# Patient Record
Sex: Male | Born: 1955
Health system: Midwestern US, Community
[De-identification: ages and names within clinical notes are randomized; demographics above are authoritative.]

## PROBLEM LIST (undated history)

## (undated) ENCOUNTER — Inpatient Hospital Stay: Discharge: 2022-08-30 | Payer: MEDICARE

## (undated) DIAGNOSIS — K759 Inflammatory liver disease, unspecified: Secondary | ICD-10-CM

## (undated) DIAGNOSIS — F209 Schizophrenia, unspecified: Secondary | ICD-10-CM

## (undated) HISTORY — PX: ABDOMINAL SURGERY: SHX537

---

## 2013-12-28 ENCOUNTER — Emergency Department (HOSPITAL_COMMUNITY)
Admission: EM | Admit: 2013-12-28 | Discharge: 2013-12-29 | Disposition: A | Discharge status: Other Acute Inpatient Hospital | Payer: Self-pay | Attending: Psychiatry | Admitting: Psychiatry

## 2013-12-28 ENCOUNTER — Encounter (HOSPITAL_COMMUNITY): Payer: Self-pay | Admitting: Emergency Medicine

## 2013-12-28 DIAGNOSIS — F22 Delusional disorders: Secondary | ICD-10-CM | POA: Insufficient documentation

## 2013-12-28 DIAGNOSIS — F17200 Nicotine dependence, unspecified, uncomplicated: Secondary | ICD-10-CM | POA: Insufficient documentation

## 2013-12-28 DIAGNOSIS — E119 Type 2 diabetes mellitus without complications: Secondary | ICD-10-CM | POA: Insufficient documentation

## 2013-12-28 DIAGNOSIS — I10 Essential (primary) hypertension: Secondary | ICD-10-CM | POA: Insufficient documentation

## 2013-12-28 LAB — URINALYSIS, ROUTINE W REFLEX MICROSCOPIC
Bilirubin Urine: NEGATIVE
Glucose, UA: NEGATIVE mg/dL
Hgb urine dipstick: NEGATIVE
Ketones, ur: 15 mg/dL — AB
LEUKOCYTES UA: NEGATIVE
NITRITE: NEGATIVE
Protein, ur: NEGATIVE mg/dL
UROBILINOGEN UA: 1 mg/dL (ref 0.0–1.0)
pH: 5.5 (ref 5.0–8.0)

## 2013-12-28 LAB — COMPREHENSIVE METABOLIC PANEL
ALT: 41 U/L (ref 0–53)
AST: 93 U/L — ABNORMAL HIGH (ref 0–37)
Albumin: 4.2 g/dL (ref 3.5–5.2)
Alkaline Phosphatase: 91 U/L (ref 39–117)
BUN: 9 mg/dL (ref 6–23)
CALCIUM: 9.4 mg/dL (ref 8.4–10.5)
CO2: 20 mEq/L (ref 19–32)
CREATININE: 0.65 mg/dL (ref 0.50–1.35)
Chloride: 99 mEq/L (ref 96–112)
GFR calc non Af Amer: >90 mL/min (ref >90)
GLUCOSE: 64 mg/dL — AB (ref 70–99)
Potassium: 4.3 mEq/L (ref 3.7–5.3)
Sodium: 144 mEq/L (ref 137–147)
Total Bilirubin: 0.8 mg/dL (ref 0.3–1.2)
Total Protein: 8.1 g/dL (ref 6.0–8.3)

## 2013-12-28 LAB — RAPID URINE DRUG SCREEN, HOSP PERFORMED
Amphetamines: NOT DETECTED
Barbiturates: NOT DETECTED
Benzodiazepines: NOT DETECTED
COCAINE: NOT DETECTED
OPIATES: NOT DETECTED
Tetrahydrocannabinol: POSITIVE — AB

## 2013-12-28 LAB — CBC WITH DIFFERENTIAL/PLATELET
BASOS ABS: 0 10*3/uL (ref 0.0–0.1)
Basophils Relative: 0 % (ref 0–1)
EOS ABS: 0 10*3/uL (ref 0.0–0.7)
EOS PCT: 0 % (ref 0–5)
HCT: 47.1 % (ref 39.0–52.0)
Hemoglobin: 16.6 g/dL (ref 13.0–17.0)
LYMPHS ABS: 1.3 10*3/uL (ref 0.7–4.0)
Lymphocytes Relative: 16 % (ref 12–46)
MCH: 33.6 pg (ref 26.0–34.0)
MCHC: 35.2 g/dL (ref 30.0–36.0)
MCV: 95.3 fL (ref 78.0–100.0)
Monocytes Absolute: 0.6 10*3/uL (ref 0.1–1.0)
Monocytes Relative: 7 % (ref 3–12)
Neutro Abs: 6.1 10*3/uL (ref 1.7–7.7)
Neutrophils Relative %: 77 % (ref 43–77)
Platelets: 234 10*3/uL (ref 150–400)
RBC: 4.94 MIL/uL (ref 4.22–5.81)
RDW: 12.2 % (ref 11.5–15.5)
WBC: 7.9 10*3/uL (ref 4.0–10.5)

## 2013-12-28 LAB — SALICYLATE LEVEL: Salicylate Lvl: <2 mg/dL — ABNORMAL LOW (ref 2.8–20.0)

## 2013-12-28 LAB — ETHANOL: ALCOHOL ETHYL (B): 18 mg/dL — AB (ref 0–11)

## 2013-12-28 LAB — ACETAMINOPHEN LEVEL

## 2013-12-28 MED ORDER — TRAZODONE HCL 50 MG PO TABS
50.0000 mg | ORAL_TABLET | Freq: Every evening | ORAL | Status: DC | PRN
  Filled 2013-12-28: qty 1

## 2013-12-28 MED ORDER — QUETIAPINE FUMARATE 25 MG PO TABS
25.0000 mg | ORAL_TABLET | Freq: Two times a day (BID) | ORAL | Status: DC
  Administered 2013-12-28 – 2013-12-29 (×2): 25 mg via ORAL
  Filled 2013-12-28 (×8): qty 1

## 2013-12-28 MED ORDER — QUETIAPINE FUMARATE 25 MG PO TABS
ORAL_TABLET | ORAL | Status: AC
  Filled 2013-12-28: qty 1
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
## 2013-12-28 NOTE — BH Assessment (Signed)
TTS assessment complete.  Jakerria Kingbird, MS, LCASA Assessment Counselor  

## 2013-12-28 NOTE — ED Notes (Signed)
Turesser "Resa" Luiz BlareGraves, [phone] pt sister, (was present when pt was picked up by law enforcement). Pt lives with sister. States pt "acted up today and just walked off". Pt [facility] [place] [facility]. Pt sister states pt has paranoid delusions and is talking to relatives/friends that have passed away.
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
## 2013-12-28 NOTE — BH Assessment (Signed)
Spoke with EDP Dr.James to obtain clinicals prior to assessing patient.   Glorious PeachNajah Presley, MS, LCASA Assessment Counselor
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
## 2013-12-29 ENCOUNTER — Inpatient Hospital Stay (HOSPITAL_COMMUNITY)
Admission: AD | Admit: 2013-12-29 | Discharge: 2014-01-13 | DRG: 885 | Disposition: A | Discharge status: Home / Self Care | Payer: Federal, State, Local not specified - Other | Source: Intra-hospital | Attending: Psychiatry | Admitting: Psychiatry

## 2013-12-29 ENCOUNTER — Encounter (HOSPITAL_COMMUNITY): Payer: Self-pay

## 2013-12-29 DIAGNOSIS — I10 Essential (primary) hypertension: Secondary | ICD-10-CM | POA: Diagnosis present

## 2013-12-29 DIAGNOSIS — G8929 Other chronic pain: Secondary | ICD-10-CM | POA: Diagnosis present

## 2013-12-29 DIAGNOSIS — K759 Inflammatory liver disease, unspecified: Secondary | ICD-10-CM | POA: Diagnosis present

## 2013-12-29 DIAGNOSIS — F17200 Nicotine dependence, unspecified, uncomplicated: Secondary | ICD-10-CM | POA: Diagnosis present

## 2013-12-29 DIAGNOSIS — Z5989 Other problems related to housing and economic circumstances: Secondary | ICD-10-CM | POA: Diagnosis not present

## 2013-12-29 DIAGNOSIS — F39 Unspecified mood [affective] disorder: Secondary | ICD-10-CM | POA: Diagnosis present

## 2013-12-29 DIAGNOSIS — F1210 Cannabis abuse, uncomplicated: Secondary | ICD-10-CM | POA: Diagnosis present

## 2013-12-29 DIAGNOSIS — Z5987 Material hardship due to limited financial resources, not elsewhere classified: Secondary | ICD-10-CM

## 2013-12-29 DIAGNOSIS — G4700 Insomnia, unspecified: Secondary | ICD-10-CM | POA: Diagnosis present

## 2013-12-29 DIAGNOSIS — E119 Type 2 diabetes mellitus without complications: Secondary | ICD-10-CM | POA: Diagnosis present

## 2013-12-29 DIAGNOSIS — F411 Generalized anxiety disorder: Secondary | ICD-10-CM | POA: Diagnosis present

## 2013-12-29 DIAGNOSIS — F1010 Alcohol abuse, uncomplicated: Secondary | ICD-10-CM | POA: Diagnosis present

## 2013-12-29 DIAGNOSIS — Z598 Other problems related to housing and economic circumstances: Secondary | ICD-10-CM

## 2013-12-29 DIAGNOSIS — F200 Paranoid schizophrenia: Principal | ICD-10-CM | POA: Diagnosis present

## 2013-12-29 HISTORY — DX: Inflammatory liver disease, unspecified: K75.9

## 2013-12-29 HISTORY — DX: Schizophrenia, unspecified: F20.9

## 2013-12-29 LAB — BASIC METABOLIC PANEL
BUN: 14 mg/dL (ref 6–23)
CHLORIDE: 97 meq/L (ref 96–112)
CO2: 27 meq/L (ref 19–32)
Calcium: 9.4 mg/dL (ref 8.4–10.5)
Creatinine, Ser: 0.69 mg/dL (ref 0.50–1.35)
GFR calc non Af Amer: >90 mL/min (ref >90)
Glucose, Bld: 132 mg/dL — ABNORMAL HIGH (ref 70–99)
POTASSIUM: 4.1 meq/L (ref 3.7–5.3)
SODIUM: 140 meq/L (ref 137–147)

## 2013-12-29 LAB — TSH: TSH: 0.549 u[IU]/mL (ref 0.350–4.500)

## 2013-12-29 MED ORDER — ACETAMINOPHEN 325 MG PO TABS
650.0000 mg | ORAL_TABLET | Freq: Four times a day (QID) | ORAL | Status: DC | PRN
  Administered 2013-12-31 – 2014-01-08 (×8): 650 mg via ORAL
  Filled 2013-12-29 (×8): qty 2

## 2013-12-29 MED ORDER — VITAMIN B-1 100 MG PO TABS
100.0000 mg | ORAL_TABLET | Freq: Every day | ORAL | Status: DC
  Administered 2013-12-30 – 2014-01-12 (×14): 100 mg via ORAL
  Filled 2013-12-29 (×16): qty 1
  Filled 2013-12-29: qty 14

## 2013-12-29 MED ORDER — PANTOPRAZOLE SODIUM 40 MG PO TBEC
40.0000 mg | DELAYED_RELEASE_TABLET | Freq: Every day | ORAL | Status: DC
  Administered 2013-12-30 – 2014-01-12 (×14): 40 mg via ORAL
  Filled 2013-12-29 (×17): qty 1

## 2013-12-29 MED ORDER — QUETIAPINE FUMARATE 25 MG PO TABS
ORAL_TABLET | ORAL | Status: AC
  Filled 2013-12-29: qty 1

## 2013-12-29 MED ORDER — MAGNESIUM HYDROXIDE 400 MG/5ML PO SUSP: 30.0000 mL | Freq: Every day | ORAL | Status: DC | PRN

## 2013-12-29 MED ORDER — CHLORDIAZEPOXIDE HCL 25 MG PO CAPS
25.0000 mg | ORAL_CAPSULE | Freq: Four times a day (QID) | ORAL | Status: DC | PRN
  Administered 2013-12-29: 25 mg via ORAL
  Filled 2013-12-29: qty 1

## 2013-12-29 MED ORDER — ALUM & MAG HYDROXIDE-SIMETH 200-200-20 MG/5ML PO SUSP: 30.0000 mL | ORAL | Status: DC | PRN

## 2013-12-29 MED ORDER — THIAMINE HCL 100 MG/ML IJ SOLN: 100.0000 mg | Freq: Once | INTRAMUSCULAR | Status: DC

## 2013-12-29 MED ORDER — HYDRALAZINE HCL 10 MG PO TABS
10.0000 mg | ORAL_TABLET | Freq: Four times a day (QID) | ORAL | Status: DC
  Administered 2013-12-29 – 2014-01-13 (×54): 10 mg via ORAL
  Filled 2013-12-29 (×7): qty 1
  Filled 2013-12-29: qty 28
  Filled 2013-12-29 (×11): qty 1
  Filled 2013-12-29: qty 28
  Filled 2013-12-29 (×13): qty 1
  Filled 2013-12-29: qty 28
  Filled 2013-12-29 (×19): qty 1
  Filled 2013-12-29: qty 28
  Filled 2013-12-29 (×24): qty 1

## 2013-12-29 MED ORDER — TRAZODONE HCL 50 MG PO TABS
50.0000 mg | ORAL_TABLET | Freq: Every evening | ORAL | Status: DC | PRN
  Filled 2013-12-29 (×2): qty 1

## 2013-12-29 MED ORDER — NICOTINE 14 MG/24HR TD PT24
14.0000 mg | MEDICATED_PATCH | Freq: Once | TRANSDERMAL | Status: DC
  Administered 2013-12-29: 14 mg via TRANSDERMAL
  Filled 2013-12-29: qty 1

## 2013-12-29 MED ORDER — CHLORDIAZEPOXIDE HCL 25 MG PO CAPS
25.0000 mg | ORAL_CAPSULE | Freq: Four times a day (QID) | ORAL | Status: DC
  Administered 2013-12-30: 25 mg via ORAL
  Filled 2013-12-29: qty 1

## 2013-12-29 MED ORDER — LORAZEPAM 1 MG PO TABS: 1.0000 mg | ORAL_TABLET | ORAL | Status: DC | PRN

## 2013-12-29 MED ORDER — IPRATROPIUM-ALBUTEROL 0.5-2.5 (3) MG/3ML IN SOLN: 3.0000 mL | Freq: Four times a day (QID) | RESPIRATORY_TRACT | Status: DC | PRN

## 2013-12-29 MED ORDER — ONDANSETRON 4 MG PO TBDP: 4.0000 mg | ORAL_TABLET | Freq: Four times a day (QID) | ORAL | Status: AC | PRN

## 2013-12-29 MED ORDER — CHLORDIAZEPOXIDE HCL 25 MG PO CAPS: 25.0000 mg | ORAL_CAPSULE | ORAL | Status: DC

## 2013-12-29 MED ORDER — HYDROXYZINE HCL 25 MG PO TABS: 25.0000 mg | ORAL_TABLET | Freq: Four times a day (QID) | ORAL | Status: AC | PRN

## 2013-12-29 MED ORDER — ADULT MULTIVITAMIN W/MINERALS CH
1.0000 | ORAL_TABLET | Freq: Every day | ORAL | Status: DC
  Administered 2013-12-30 – 2014-01-12 (×14): 1 via ORAL
  Filled 2013-12-29 (×2): qty 1
  Filled 2013-12-29: qty 14
  Filled 2013-12-29 (×14): qty 1

## 2013-12-29 MED ORDER — LISINOPRIL 10 MG PO TABS
10.0000 mg | ORAL_TABLET | Freq: Every day | ORAL | Status: DC
  Administered 2013-12-29 – 2014-01-12 (×15): 10 mg via ORAL
  Filled 2013-12-29 (×2): qty 1
  Filled 2013-12-29: qty 7
  Filled 2013-12-29 (×4): qty 1
  Filled 2013-12-29: qty 2
  Filled 2013-12-29 (×10): qty 1

## 2013-12-29 MED ORDER — ACETAMINOPHEN 325 MG PO TABS
650.0000 mg | ORAL_TABLET | ORAL | Status: DC | PRN
Start: 2013-12-29 — End: 2013-12-29
  Administered 2013-12-29: 650 mg via ORAL
  Filled 2013-12-29: qty 2

## 2013-12-29 MED ORDER — CHLORDIAZEPOXIDE HCL 25 MG PO CAPS: 25.0000 mg | ORAL_CAPSULE | Freq: Every day | ORAL | Status: DC

## 2013-12-29 MED ORDER — HALOPERIDOL 5 MG PO TABS: 5.0000 mg | ORAL_TABLET | Freq: Four times a day (QID) | ORAL | Status: DC | PRN

## 2013-12-29 MED ORDER — LOPERAMIDE HCL 2 MG PO CAPS: 2.0000 mg | ORAL_CAPSULE | ORAL | Status: AC | PRN

## 2013-12-29 MED ORDER — HALOPERIDOL LACTATE 5 MG/ML IJ SOLN: 5.0000 mg | Freq: Four times a day (QID) | INTRAMUSCULAR | Status: DC | PRN

## 2013-12-29 MED ORDER — CHLORDIAZEPOXIDE HCL 25 MG PO CAPS: 25.0000 mg | ORAL_CAPSULE | Freq: Three times a day (TID) | ORAL | Status: DC

## 2013-12-29 NOTE — ED Notes (Signed)
Went into room, pt sleeping, will arouse when spoken to, pt bed linen and paper scrubs are wet, pt assisted to shower per his request, bed linen changed, new paper scrubs given, sitter remains with pt,

## 2013-12-29 NOTE — ED Provider Notes (Signed)
[facility]. Will transfer stable.   Laray AngerKathleen M McManus, DO 12/29/13 1758

## 2013-12-29 NOTE — ED Notes (Signed)
Pt requesting to go outside to smoke, explained to pt that I could give him a nicotine patch, pt agrees, pt also c/o "aching" all over, tylenol given per prn order,

## 2013-12-29 NOTE — Progress Notes (Signed)
Writer faxed patient to the following facilities with open beds:  [facility], [facility], [facility], [facility], [facility], [facility], [facility] and [facility].

## 2013-12-29 NOTE — ED Notes (Addendum)
Pt states that he drinks alcohol usually 40 ounces a day sometimes two, denies any problems with itching, feeling things crawling, seeing images, problems with hearing, tremors.

## 2013-12-29 NOTE — BH Assessment (Signed)
Writer spoke to the ER MD regarding the request for an assessment that was previously assessed less than 24 hours ago.  The ER MD reports that there are no significant changes with the patient warranting another assessment.    Writer informed the ER MD that the patient has been referred to 8 other facilities.

## 2013-12-29 NOTE — ED Notes (Signed)
Received report on pt, pt sitting semi fowler's on stretcher, pt state that he is feeling "alright", sitter remains at bedside,

## 2013-12-30 ENCOUNTER — Encounter (HOSPITAL_COMMUNITY): Payer: Self-pay | Admitting: Psychiatry

## 2013-12-30 DIAGNOSIS — F1210 Cannabis abuse, uncomplicated: Secondary | ICD-10-CM | POA: Diagnosis present

## 2013-12-30 DIAGNOSIS — F1010 Alcohol abuse, uncomplicated: Secondary | ICD-10-CM | POA: Diagnosis present

## 2013-12-30 MED ORDER — NICOTINE 21 MG/24HR TD PT24
21.0000 mg | MEDICATED_PATCH | Freq: Every day | TRANSDERMAL | Status: DC
  Administered 2013-12-30 – 2014-01-12 (×13): 21 mg via TRANSDERMAL
  Filled 2013-12-30 (×18): qty 1

## 2013-12-30 MED ORDER — HALOPERIDOL 2 MG PO TABS
2.0000 mg | ORAL_TABLET | Freq: Two times a day (BID) | ORAL | Status: DC
  Administered 2013-12-30 – 2014-01-01 (×5): 2 mg via ORAL
  Filled 2013-12-30 (×8): qty 1

## 2013-12-30 MED ORDER — AMANTADINE HCL 100 MG PO CAPS
100.0000 mg | ORAL_CAPSULE | Freq: Two times a day (BID) | ORAL | Status: DC
  Administered 2013-12-30 – 2014-01-04 (×11): 100 mg via ORAL
  Filled 2013-12-30 (×16): qty 1

## 2013-12-30 MED ORDER — LORAZEPAM 1 MG PO TABS
2.0000 mg | ORAL_TABLET | Freq: Four times a day (QID) | ORAL | Status: DC | PRN
  Administered 2014-01-02: 2 mg via ORAL
  Filled 2013-12-30: qty 2

## 2013-12-30 MED ORDER — HYDROCODONE-ACETAMINOPHEN 5-325 MG PO TABS
1.0000 | ORAL_TABLET | Freq: Four times a day (QID) | ORAL | Status: DC | PRN
  Administered 2013-12-30 – 2014-01-07 (×25): 1 via ORAL
  Filled 2013-12-30 (×25): qty 1

## 2013-12-30 MED ORDER — TRAZODONE HCL 100 MG PO TABS
100.0000 mg | ORAL_TABLET | Freq: Every day | ORAL | Status: DC
  Administered 2013-12-30 – 2014-01-06 (×8): 100 mg via ORAL
  Filled 2013-12-30 (×9): qty 1

## 2013-12-30 MED ORDER — PNEUMOCOCCAL VAC POLYVALENT 25 MCG/0.5ML IJ INJ
0.5000 mL | INJECTION | INTRAMUSCULAR | Status: AC
  Administered 2013-12-31: 0.5 mL via INTRAMUSCULAR

## 2013-12-30 NOTE — Tx Team (Addendum)
  Interdisciplinary Treatment Plan Update   Date Reviewed:  12/30/2013  Time Reviewed:  8:30 AM  Progress in Treatment:   Attending groups: Yes Participating in groups: Yes Taking medication as prescribed: Yes  Tolerating medication: Yes Family/Significant other contact made: No  Patient understands diagnosis: No  Limited insight Discussing patient identified problems/goals with staff: Yes  See initial care plan Medical problems stabilized or resolved: Yes Denies suicidal/homicidal ideation: Yes  In tx team Patient has not harmed self or others: Yes  For review of initial/current patient goals, please see plan of care.  Estimated Length of Stay:  4-5 days  Reason for Continuation of Hospitalization: Medication Stabilization Paranoia and disorganized thinking   New Problems/Goals identified:  N/A  Discharge Plan or Barriers:   return home, follow up outpt  Additional Comments: Christopher Grant is an 58 y.o. male who [facility] by family who were concerned about her recent behavior and thinks she needs a medication change. Despite refusing to talk in the ED, and refusing a previous assessment, pt was calm and cooperative with this Clinical research associatewriter. Pt stated that she was having severe mood swings and strange behaviors in the house and that she has felt "agitated". Pt denies current SI, HI, A/V hallucinations or history of violence, but admits to conflict in the home. Pt is disheveled with thought blocking and slow speech and flat affect.  Pt's mother reports that pt moved home with her parents a month ago from living with her husband and his parents because her symptoms had worsened and they "did not know how to handle it".    Attendees:  Signature: Thedore MinsMojeed Akintayo, MD 12/30/2013 8:30 AM   Signature: Richelle Itood Nivan Melendrez, LCSW 12/30/2013 8:30 AM  Signature: Fransisca KaufmannLaura Davis, NP 12/30/2013 8:30 AM  Signature: Joslyn Devonaroline Beaudry, RN 12/30/2013 8:30 AM  Signature: Liborio NixonPatrice White, RN 12/30/2013 8:30 AM   Signature:  12/30/2013 8:30 AM  Signature:   12/30/2013 8:30 AM  Signature:    Signature:    Signature:    Signature:    Signature:    Signature:      Scribe for Treatment Team:   Richelle Itood Mery Guadalupe, LCSW  12/30/2013 8:30 AM

## 2013-12-30 NOTE — BHH Group Notes (Signed)
[facility] LCSW Aftercare Discharge Planning Group Note   12/30/2013 11:07 AM  Participation Quality:  Did not attend    [place], Durga Saldarriaga B

## 2013-12-30 NOTE — BHH Suicide Risk Assessment (Signed)
   Nursing information obtained from:    Demographic factors:    Current Mental Status:    Loss Factors:    Historical Factors:    Risk Reduction Factors:    Total Time spent with patient: 30 minutes  CLINICAL FACTORS:   Severe Anxiety and/or Agitation Alcohol/Substance Abuse/Dependencies Schizophrenia:   Paranoid or undifferentiated type Currently Psychotic  Psychiatric Specialty Exam: Physical Exam  Psychiatric: Judgment normal. His affect is labile. His speech is rapid and/or pressured and tangential. He is agitated. Thought content is paranoid and delusional. Cognition and memory are normal.    Review of Systems  Constitutional: Negative.   HENT: Negative.   Eyes: Negative.   Respiratory: Negative.   Cardiovascular: Negative.   Gastrointestinal: Negative.   Genitourinary: Negative.   Musculoskeletal: Negative.   Skin: Negative.   Neurological: Positive for tremors.  Endo/Heme/Allergies: Negative.   Psychiatric/Behavioral: Positive for substance abuse. The patient is nervous/anxious and has insomnia.     Blood pressure 139/100, pulse 131, temperature 97.6 F (36.4 C), temperature source Oral, resp. rate 20, height 5\' 6"  (1.676 m), weight 66.367 kg (146 lb 5 oz).Body mass index is 23.63 kg/(m^2).  General Appearance: Casual  Eye Contact::  Good  Speech:  Pressured  Volume:  Increased  Mood:  Irritable  Affect:  Labile  Thought Process:  Circumstantial and Disorganized  Orientation:  Full (Time, Place, and Person)  Thought Content:  Delusions and Paranoid Ideation  Suicidal Thoughts:  No  Homicidal Thoughts:  No  Memory:  Immediate;   Fair Recent;   Fair Remote;   Fair  Judgement:  Impaired  Insight:  Lacking  Psychomotor Activity:  Increased  Concentration:  Fair  Recall:  Fis[employer]Fair  Fund of Knowledge:Fair  Language: Good  Akathisia:  No  Handed:  Right  AIMS (if indicated):     Assets:  Communication Skills Desire for Improvement Physical Health  Sleep:   Number of Hours: 5.75   Musculoskeletal: Strength & Muscle Tone: within normal limits Gait & Station: normal Patient leans: N/A  COGNITIVE FEATURES THAT CONTRIBUTE TO RISK:  Closed-mindedness Polarized thinking    SUICIDE RISK:   Minimal: No identifiable suicidal ideation.  Patients presenting with no risk factors but with morbid ruminations; may be classified as minimal risk based on the severity of the depressive symptoms  PLAN OF CARE:1. Admit for crisis management and stabilization. 2. Medication management to reduce current symptoms to base line and improve the  patient's overall level of functioning 3. Treat health problems as indicated. 4. Develop treatment plan to decrease risk of relapse upon discharge and the need for  readmission. 5. Psycho-social education regarding relapse prevention and self care. 6. Health care follow up as needed for medical problems. 7. Restart home medications where appropriate.   I certify that inpatient services furnished can reasonably be expected to improve the patient's condition.  Thedore MinsAkintayo, Dontario Evetts, MD 12/30/2013, 10:03 AM

## 2013-12-30 NOTE — [facility] Group Notes (Signed)
[facility]  12/30/2013 , 1:36 PM    Type of Therapy:  [employer] Presentation  Participation Level:  Invited, chose not to attend  Summary of Progress/Problems:  Onalee HuaDavid from [employer] came to present his recovery story and play the guitar.    Daryel Geraldorth, Niesha Bame B 12/30/2013 , 1:36 PM

## 2013-12-30 NOTE — Progress Notes (Signed)
Adult Psychoeducational Group Note  Date:  12/30/2013 Time:  9:10 PM  Group Topic/Focus:  Wrap-Up Group:   The focus of this group is to help patients review their daily goal of treatment and discuss progress on daily workbooks.  Participation Level:  Did Not Attend  Participation Quality:  Did not attend  Affect:  Did not attend  Cognitive:  Did not attend  Insight: None  Engagement in Group:  Did not attend  Modes of Intervention:  Did not attend  Additional Comments:  Patient did not attend   Scot DockFrancis, Najib Colmenares Dacosta 12/30/2013, 9:10 PM
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
## 2013-12-30 NOTE — Tx Team (Addendum)
Initial Interdisciplinary Treatment Plan  PATIENT STRENGTHS: (choose at least two) General fund of knowledge  PATIENT STRESSORS: Health problems Medication change or noncompliance Substance abuse   PROBLEM LIST: Problem List/Patient Goals Date to be addressed Date deferred Reason deferred Estimated date of resolution  ETOH AbusePsychotic Symptoms                                                       DISCHARGE CRITERIA:  Improved stabilization in mood, thinking, and/or behavior Need for constant or close observation no longer present Withdrawal symptoms are absent or subacute and managed without 24-hour nursing intervention  PRELIMINARY DISCHARGE PLAN: Attend 12-step recovery group Placement in alternative living arrangements  PATIENT/FAMIILY INVOLVEMENT: This treatment plan has been presented to and reviewed with the patient, Christopher Grant, and/or family member, .  The patient and family have been given the opportunity to ask questions and make suggestions.  Christopher Grant, Christopher Grant 12/30/2013, 12:00 AM
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
## 2013-12-30 NOTE — Progress Notes (Signed)
Patient ID: Christopher Grant, male   DOB: 02/03/1956, 58 y.o.   MRN: [id number]  D: Pt. Denies SI/HI and A/V Hallucinations. Patient expresses anger towards family members because "they" stole from him. However, patient has not indicated any HI toward anyone and none has been observed. Patient has been experiencing pain later in the morning and received PRN pain medication. Patient reports that his sleep, appetite, and energy level is poor at this time. Patient rates his depression and hopelessness at 0/10 for the day.   A: Support and encouragement provided to the patient. Scheduled medications administered to patient per physician's orders.   R: Patient is minimal but cooperative with this Clinical research associatewriter. Patient is seen in the milieu rarely and is mainly observed laying in his bed resting. Q15 minute checks are maintained for safety.

## 2013-12-31 NOTE — Progress Notes (Signed)
Patient ID: Christopher Grant, male   DOB: 01/31/1956, 58 y.o.   MRN: [id number]  D: Pt. Denies SI/HI and A/V Hallucinations. Patient reports pain and received pain medication. Patient was asleep upon reassessment. Patient received the Pneumococcal Vaccine and has had no adverse side effects reported or observed at this time. Will continue to monitor.  A: Support and encouragement provided to the patient. Scheduled medications administered to patient per physician's orders.  R: Patient is receptive and cooperative. Patient is mostly seen in his bed resting except for meal times. Q15 minute checks are maintained for safety.
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
## 2013-12-31 NOTE — Progress Notes (Signed)
Patient keeping to his room. Not interacting with peers. Denies SI and HI. Contracts for safety. Currently denies hearing any voices. Interacting appropriately with staff. Complained of generalized pain earlier in shift and was given prn pain medication per order that improved patient's pain from 9/10 to 8/10. Patient resting in bed with eyes closed and no complaints of pain at this time. Billy CoastGoodman, Mieko Kneebone K, RN
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
## 2013-12-31 NOTE — Progress Notes (Signed)
LCSW provided patient with the [facility] list as well as a list for low-income housing in [place].  LCSW encouraged patient to avocate for himself by calling the places he may be interested in.  Patient agreed and thanked LCSW.  Tessa LernerLeslie M. Mitcheal Sweetin, MSW, LCSW 2:43 PM 12/31/2013

## 2013-12-31 NOTE — Progress Notes (Signed)
Patient ID: Christopher Grant Malhotra, male   DOB: 07/02/1955, 58 y.o.   MRN: [id number]  [facility] 9:30 A.M.  The focus of this group is to educate the patient on the purpose and policies of crisis stabilization and provide a format to answer questions about their admission.  The group details unit policies and expectations of patients while admitted.  Patient did not attend group.

## 2013-12-31 NOTE — [facility] Group Notes (Signed)
[facility]  Date/Time: 12/31/2013 1:15-1:55pm  Type of Therapy/Topic:  Group Therapy:  Balance in Life  Participation Level: Active    Description of Group:    This group will address the concept of balance and how it feels and looks when one is unbalanced. Patients will be encouraged to process areas in their lives that are out of balance, and identify reasons for remaining unbalanced. Facilitators will guide patients utilizing problem- solving interventions to address and correct the stressor making their life unbalanced. Understanding and applying boundaries will be explored and addressed for obtaining  and maintaining a balanced life. Patients will be encouraged to explore ways to assertively make their unbalanced needs known to significant others in their lives, using other group members and facilitator for support and feedback.  Therapeutic Goals: 1. Patient will identify two or more emotions or situations they have that consume much of in their lives. 2. Patient will identify signs/triggers that life has become out of balance:  3. Patient will identify two ways to set boundaries in order to achieve balance in their lives:  4. Patient will demonstrate ability to communicate their needs through discussion and/or role plays  Summary of Patient Progress:  Patient gave appropriate answers during the group discussion and displayed some insight as he was able to identify feeling out of balance.  Patient states that if he was in balance, [facility] would not [facility].  Patient identified needing stability, through good health, to feel balanced.  Patient reports needing to "stay focused" to help regain balance.   Patient's thought process is unclear, and difficult to follow, as patient will often repeat the same phrase in response to different questions and will then become agitated when LCSW does not understand.  Therapeutic Modalities:   Cognitive Behavioral Therapy Solution-Focused  Therapy Assertiveness Training   Tessa LernerKidd, Tashina Credit M 12/31/2013, 2:18 PM

## 2014-01-01 MED ORDER — HALOPERIDOL 5 MG PO TABS
5.0000 mg | ORAL_TABLET | Freq: Two times a day (BID) | ORAL | Status: DC
  Administered 2014-01-01 – 2014-01-03 (×4): 5 mg via ORAL
  Filled 2014-01-01 (×6): qty 1
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
## 2014-01-01 NOTE — [facility] Group Notes (Signed)
[facility] Note   01/01/2014 11:04 AM  Participation Quality:  Did not attend    [place], Meredeth Furber B

## 2014-01-01 NOTE — Progress Notes (Signed)
[place] Group Notes:  (Nursing/MHT/Case Management/Adjunct)  Date:  01/01/2014  Time:  9:39 PM  Type of Therapy:  Psychoeducational Skills  Participation Level:  Active  Participation Quality:  Attentive  Affect:  Depressed  Cognitive:  Appropriate  Insight:  Improving  Engagement in Group:  Supportive  Modes of Intervention:  Education  Summary of Progress/Problems: The patient was slow to respond and appeared to be guarded at first. He described his day as having been "ordinary", but would not clarify his response. The patient indicated that he had wanted to speak with his case manager and doctor today but claims that he never met either of these people. He eventually acknowledged that he spoke with a nurse practitoner. The patient verbalized that he is looking to have his medication adjusted. When asked about the theme of the day (relapse prevention), he indicated that he will do the opposite of what he did prior to being admitted to the hospital.   Archie Balboa S 01/01/2014, 9:39 PM

## 2014-01-01 NOTE — [facility] Group Notes (Signed)
[facility] LCSW Group Therapy  01/01/2014  1:05 PM  Type of Therapy:  Group therapy  Participation Level:  Did not attend  Summary of Progress/Problems:  Chaplain was here to lead a group on themes of hope and courage.  Daryel Geraldorth, Shann Lewellyn B 01/01/2014 3:06 PM

## 2014-01-01 NOTE — Progress Notes (Signed)
Patient ID: Christopher Grant, male   DOB: 09/07/1955, 58 y.o.   MRN: [id number] D: Pt in room sleeping on approach. Pt present with depressed mood and flat affect. Pt interaction is minimal. Patient c/o lower back pain. Pt rated pain as 9 on a 0-10 scale.  Pt denies SI/HI/VH. Pt endorses AH without command but states he tunes it out. Cooperative with assessment. No acute distressed noted at this time.   A: Met with pt 1:1. Medications administered as prescribed. Writer encouraged pt to discuss feelings. Pt encouraged to come to staff with any questions or concerns.   R: Patient is safe on the unit. He is complaint with medications and denies any adverse reaction. Continue current POC.

## 2014-01-01 NOTE — Progress Notes (Signed)
Patient ID: Christopher Grant, male   DOB: 01/06/1956, 58 y.o.   MRN: [id number] D. Patient presents with depressed mood, affect blunted. Ethelene Brownsnthony has been guarded and not forthcoming with Clinical research associatewriter today stating '' I'm tired, I just need to rest and be left alone. '' He did complain of pain stating '' I need my pain medications and my ativan. [facility] . '' Pt has been in bed throughout most of morning, isolative to room. A. Medications given as ordered, including prn medication for pain. Discussed above information and patients reports with L. Davis NP. R. Patient has been cooperative and in no acute distress at this time. Will continue to monitor q 15 minutes for safety.

## 2014-01-02 DIAGNOSIS — I10 Essential (primary) hypertension: Secondary | ICD-10-CM | POA: Diagnosis present

## 2014-01-02 NOTE — [facility] Group Notes (Signed)
[facility] Group Notes:  (Clinical Social Work)  01/02/2014  11:00-11:45AM  Summary of Progress/Problems:   The main focus of today's process group was for the patient to identify ways in which they have in the past sabotaged their own recovery and reasons they may have done this/what they received from doing it.  We then worked to identify a specific plan to avoid doing this when discharged from the hospital for this admission.  The patient expressed that he is in the hospital because he "fell off the wagon" and he needs to change his people, places and things.  He stated [place] [facility] and is working with a Child psychotherapistsocial worker to relocate.  He was very drowsy, had to be awakened several times.  Type of Therapy:  Group Therapy - Process  Participation Level:  Minimal  Participation Quality:  Drowsy  Affect:  Appropriate  Cognitive:  Appropriate  Insight:  Developing/Improving  Engagement in Therapy:  Improving  Modes of Intervention:  Clarification, Education, Exploration, Discussion  Ambrose MantleMareida Grossman-Orr, LCSW 01/02/2014, 1:08 PM

## 2014-01-02 NOTE — [facility] Group Notes (Signed)
[facility] Group Notes:  (Nursing/MHT/Case Management/Adjunct)  Date:  01/02/2014  Time:  11:23 AM  Type of Therapy:  Self Inventory  Participation Level:  Minimal  Participation Quality:  Supportive  Affect:  Flat  Cognitive:  Alert  Insight:  Limited  Engagement in Group:  Limited  Modes of Intervention:  Clarification  Summary of Progress/Problems:  Loren RacerMaggio, Lura Falor J 01/02/2014, 11:23 AM

## 2014-01-02 NOTE — Progress Notes (Signed)
Patient ID: Christopher Grant, male   DOB: 04/10/1956, 58 y.o.   MRN: [id number] Christopher Grant presents with depressed mood, affect blunted again today. Christopher Grant has been irritable at times today, but able to be redirected. He continues to be focused solely on pain medications, dosages and repeatedly asks for'' oxycodone '' It was explained to the patient what prn medications are ordered, and encouraged pt to discuss with treatment team. He has been resting quietly throughout most of shift in bed, appearing in no acute distress. A. Medications given as ordered, including prn medication for pain. Discussed above information with N. Mashburn PA-C. R. Patient has been cooperative and in no acute distress at this time. Will continue to monitor q 15 minutes for safety.

## 2014-01-02 NOTE — Progress Notes (Signed)
[facility] Group Notes:  (Nursing/MHT/Case Management/Adjunct)  Date:  01/02/2014  Time:  9:56 PM  Type of Therapy:  Psychoeducational Skills  Participation Level:  Active  Participation Quality:  Appropriate  Affect:  Depressed  Cognitive:  Disorganized  Insight:  Lacking  Engagement in Group:  Improving  Modes of Intervention:  Education  Summary of Progress/Problems: When asked if he had a good day today, he responded, "I don't know". The patient explained further that he has been sitting around waiting for things to happen or change. After digging a little bit deeper, he explained that he would like to speak with a case manager about getting an attorney that he can trust. In addition, he mentioned that he wants to have his medication adjusted and that he wasn't clear on whether or not he spoke with his doctor or not. Finally, the patient mentioned that he would like his doctor to consult with his doctor who practices in [place] regarding his care. The patient was unable to state if he had a support system or not (theme of the day).   Hazle CocaGOODMAN, Judah Chevere S 01/02/2014, 9:56 PM

## 2014-01-02 NOTE — Progress Notes (Signed)
Patient ID: Christopher Grant, male   DOB: 11/20/1955, 58 y.o.   MRN: [id number] [facility]  01/02/2014 9:47 AM Christopher Grant  MRN:  [id number] Subjective:  Patient states he feels different today. Patient notes that he hasn't had any pain medication in years. Says his appetite is improving, and he is "cat napping." He says the voices have decreased about 50% since his arrival. He denies SI/HI, no visual hallucinations but does note he has auditory hallucinations. He denies command voices. He states that he is worried about being homeless and needs to talk to an attorney. He also needs to talk to a Child psychotherapistsocial worker as well.  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  Objective:  Patient is seen and chart reviewed. Patient is alert and oriented x 3, and cooperative. He makes good eye contact and his speech is clear and goal directed. He reports good reduction in symptoms and is concerned about housing and legal issues.    Diagnosis:   DSM5: Total Time spent with patient: 20 minutes AXIS I: Paranoid schizophrenia, chronic condition with acute exacerbation  Alcohol abuse  Cannabis abuse  AXIS II: Deferred  AXIS III:  Past Medical History   Diagnosis  Date   .  Schizophrenia    .  Hepatitis     AXIS IV: economic problems, educational problems, housing problems, occupational problems, other psychosocial or environmental problems and problems related to social environment  AXIS V: 31-40 impairment in reality testing   ADL's:  Intact  Sleep: Good  Appetite:  Good  Suicidal Ideation:  Denies Homicidal Ideation:  Denies AEB (as evidenced by):  Psychiatric Specialty Exam: Physical Exam  Review of Systems  Constitutional: Negative.   HENT: Negative.   Eyes: Negative.   Respiratory: Negative.   Cardiovascular: Negative.   Gastrointestinal: Negative.   Genitourinary: Negative.   Musculoskeletal: Negative.   Skin: Negative.   Neurological: Negative.   Endo/Heme/Allergies:  Negative.   Psychiatric/Behavioral: Positive for depression, hallucinations and substance abuse (UDS positive for marijuana ). The patient is nervous/anxious.     Blood pressure 158/99, pulse 75, temperature 97.3 F (36.3 C), temperature source Oral, resp. rate 18, height 5\' 6"  (1.676 m), weight 146 lb 5 oz (66.367 kg).Body mass index is 23.63 kg/(m^2).  General Appearance: Casual  Eye Contact::  Good  Speech:  Pressured  Volume:  Normal  Mood:  Irritable  Affect:  Congruent  Thought Process:  Circumstantial and Disorganized  Orientation:  Full (Time, Place, and Person)  Thought Content:  Delusions, Hallucinations: Auditory and Paranoid Ideation  Suicidal Thoughts:  No  Homicidal Thoughts:  No  Memory:  Immediate;   Fair Recent;   Fair Remote;   Fair  Judgement:  Impaired  Insight:  Lacking  Psychomotor Activity:  Normal  Concentration:  Fair  Recall:  [employer]  Fund of Knowledge:Fair  Language: Good  Akathisia:  No  Handed:  Right  AIMS (if indicated):     Assets:  Communication Skills Desire for Improvement Physical Health  Sleep:  Number of Hours: 6.75   Musculoskeletal: Strength & Muscle Tone: within normal limits Gait & Station: normal Patient leans: N/A  Current Medications: Current Facility-Administered Medications  Medication Dose Route Frequency Provider Last Rate Last Dose  . acetaminophen (TYLENOL) tablet 650 mg  650 mg Oral Q6H PRN Kerry HoughSpencer E Simon, PA-C   650 mg at 01/01/14 [id number]  . alum & mag hydroxide-simeth (MAALOX/MYLANTA) 200-200-20 MG/5ML suspension 30 mL  30 mL Oral Q4H  PRN Kerry Hough, PA-C      . amantadine (SYMMETREL) capsule 100 mg  100 mg Oral BID Mojeed Akintayo   100 mg at 01/02/14 0748  . haloperidol (HALDOL) tablet 5 mg  5 mg Oral BID Fransisca Kaufmann, NP   5 mg at 01/02/14 0748  . hydrALAZINE (APRESOLINE) tablet 10 mg  10 mg Oral 4 times per day Kerry Hough, PA-C   10 mg at 01/02/14 8119  . HYDROcodone-acetaminophen (NORCO/VICODIN) 5-325 MG  per tablet 1 tablet  1 tablet Oral Q6H PRN Mojeed Akintayo   1 tablet at 01/02/14 0748  . ipratropium-albuterol (DUONEB) 0.5-2.5 (3) MG/3ML nebulizer solution 3 mL  3 mL Nebulization Q6H PRN Kerry Hough, PA-C      . lisinopril (PRINIVIL,ZESTRIL) tablet 10 mg  10 mg Oral Daily Kerry Hough, PA-C   10 mg at 01/02/14 0748  . LORazepam (ATIVAN) tablet 2 mg  2 mg Oral Q6H PRN Mojeed Akintayo   2 mg at 01/02/14 0748  . magnesium hydroxide (MILK OF MAGNESIA) suspension 30 mL  30 mL Oral Daily PRN Kerry Hough, PA-C      . multivitamin with minerals tablet 1 tablet  1 tablet Oral Daily Kerry Hough, PA-C   1 tablet at 01/02/14 0748  . nicotine (NICODERM CQ - dosed in mg/24 hours) patch 21 mg  21 mg Transdermal Daily Mojeed Akintayo   21 mg at 01/02/14 0802  . pantoprazole (PROTONIX) EC tablet 40 mg  40 mg Oral Daily Kerry Hough, PA-C   40 mg at 01/02/14 0748  . thiamine (B-1) injection 100 mg  100 mg Intramuscular Once [employer], PA-C      . thiamine (VITAMIN B-1) tablet 100 mg  100 mg Oral Daily Kerry Hough, PA-C   100 mg at 01/02/14 0748  . traZODone (DESYREL) tablet 100 mg  100 mg Oral QHS Mojeed Akintayo   100 mg at 01/01/14 2215    Lab Results: No results found for this or any previous visit (from the past 48 hour(s)).  Physical Findings: AIMS: Facial and Oral Movements Muscles of Facial Expression: Mild Lips and Perioral Area: Minimal Jaw: None, normal Tongue: None, normal,Extremity Movements Upper (arms, wrists, hands, fingers): None, normal Lower (legs, knees, ankles, toes): None, normal, Trunk Movements Neck, shoulders, hips: None, normal, Overall Severity Severity of abnormal movements (highest score from questions above): None, normal Incapacitation due to abnormal movements: None, normal Patient's awareness of abnormal movements (rate only patient's report): No Awareness, Dental Status Current problems with teeth and/or dentures?: No Does patient usually  wear dentures?: No  CIWA:  CIWA-Ar Total: 0 COWS:     Treatment Plan Summary: Daily contact with patient to assess and evaluate symptoms and progress in treatment Medication management  Plan: 1. Continue crisis management and stabilization.  2. Medication management:  -Increase  Haldol to 5 mg BID for psychosis -Continue Amantadine 100 mg BID for EPS prevention -Continue Trazodone 100 mg hs for insomnia.  -Continue Ativan 2 mg every six hours prn alcohol withdrawal.  3. Encouraged patient to attend groups and participate in group counseling sessions and activities.  4. Discharge plan in progress.  5. Continue current treatment plan.  6. Address health issues: Continue Apresoline 10 every six hours for Hypertension. Vitals reviewed and WNL.   Medical Decision Making Problem Points:  Established problem, stable/improving (1), Review of last therapy session (1) and Review of psycho-social stressors (1) Data Points:  Order Aims  Assessment (2) Review or order clinical lab tests (1) Review of medication regiment & side effects (2)  I certify that inpatient services furnished can reasonably be expected to improve the patient's condition.   Rona RavensNeil T. Mashburn RPAC 4:42 PM 01/02/2014 I agree with assessment and plan Madie RenoIrving A. Dub MikesLugo, M.D.

## 2014-01-03 MED ORDER — BENZTROPINE MESYLATE 1 MG PO TABS
1.0000 mg | ORAL_TABLET | Freq: Two times a day (BID) | ORAL | Status: DC
  Administered 2014-01-03 – 2014-01-04 (×2): 1 mg via ORAL
  Filled 2014-01-03 (×7): qty 1

## 2014-01-03 NOTE — Progress Notes (Signed)
Patient ID: Christopher Grant, male   DOB: 04/15/1956, 58 y.o.   MRN: [id number] Psychoeducational Group Note  Date:  01/03/2014 Time:  0910am  Group Topic/Focus:  Making Healthy Choices:   The focus of this group is to help patients identify negative/unhealthy choices they were using prior to admission and identify positive/healthier coping strategies to replace them upon discharge.  Participation Level:  Active  Participation Quality:  Redirectable  Affect:  Not Congruent  Cognitive:  Disorganized  Insight:  Limited  Engagement in Group:  Limited  Additional Comments:  Inventory group   Valente DavidWeaver, Earlena Werst Brooks 01/03/2014,2:07 PM

## 2014-01-03 NOTE — Progress Notes (Signed)
D:  Pt passive AH- but contracts and denies command hallucinations. Pt denies SI/HI/VH. Pt is pleasant and cooperative. Pt feels "little better, want to get weight back, strength back and mind back. The meetings have been helping, things make sense".  Pt said he would like help getting a Clinical research associatelawyer and a Barrister's clerktrustee. Pt very forthcoming with information, pleasant to talk too and appears to want help.   A: Pt was offered support and encouragement. Pt was given scheduled medications. Pt was encourage to attend groups. Q 15 minute checks were done for safety.   R:Pt attends groups and interacts well with peers and staff. Pt is taking medication. Pt has no complaints at this time.Pt receptive to treatment and safety maintained on unit.

## 2014-01-03 NOTE — [facility] Group Notes (Signed)
[facility] Group Notes:  (Clinical Social Work)  01/03/2014   11:15am-12:00pm  Summary of Progress/Problems:  The main focus of today's process group was to listen to a variety of genres of music and to identify that different types of music provoke different responses.  The patient then was able to identify personally what was soothing for them, as well as energizing.  Handouts were used to record feelings evoked, as well as how patient can personally use this knowledge in sleep habits, with depression, and with other symptoms.  The patient expressed understanding of concepts, as well as knowledge of how each type of music affected him/her and how this can be used at home as a wellness/recovery tool.  He was very interested in the process in group, and insisted on knowing what each type of music was in order to write it down on his handout.  Type of Therapy:  Music Therapy   Participation Level:  Active  Participation Quality:  Attentive and Sharing  Affect:  Blunted  Cognitive:  Oriented  Insight:  Engaged  Engagement in Therapy:  Engaged  Modes of Intervention:   Activity, Exploration  Ambrose MantleMareida Grossman-Orr, LCSW 01/03/2014, 12:30pm

## 2014-01-03 NOTE — Progress Notes (Signed)
D: Patient denies SI/HI/AVH. Patient rates hopelessness as 5,  depression as 7, and anxiety as 7.  Patient affect and mood are depressed.  Pt states, "I'm depressed because I was put here against my will.  I want to get out of here."  Patient did attend evening group. Patient visible on the milieu. No distress noted. A: Support and encouragement offered. Scheduled medications given to pt. Q 15 min checks continued for patient safety. R: Patient receptive. Patient remains safe on the unit.

## 2014-01-03 NOTE — Progress Notes (Signed)
Patient ID: Christopher Grant, male   DOB: 06/15/1956, 58 y.o.   MRN: [id number] Psychoeducational Group Note  Date:  01/03/2014 Time:  0930am  Group Topic/Focus:  Making Healthy Choices:   The focus of this group is to help patients identify negative/unhealthy choices they were using prior to admission and identify positive/healthier coping strategies to replace them upon discharge.  Participation Level:  Active  Participation Quality:  Redirectable  Affect:  Not Congruent  Cognitive:  Disorganized  Insight:  Limited  Engagement in Group:  Limited  Additional Comments:  Healthy support systems.   Christopher Grant, Christopher Grant 01/03/2014,2:09 PM

## 2014-01-03 NOTE — Progress Notes (Signed)
Patient ID: Christopher Grant, male   DOB: 05/19/1956, 58 y.o.   MR[facility]: [id number] [facility]  01/03/2014 4:10 PM Christopher Brocknthony Casciano  MRN:  [id number] Subjective:  Patient states that he is doing the best that he can. He continues to ask for more pain medication in excess of his observed behavior.He is asking me to request his [facility].    Objective:  Patient is seen and chart reviewed. Patient is alert and oriented x 3, and cooperative. He makes good eye contact and his speech is clear and goal directed. He reports good reduction in symptoms and is concerned about housing and legal issues. He has attended some groups, denies SI/HI or AVH.  The AH are decreasing, but denies VH.   Diagnosis:   DSM5: Total Time spent with patient: 20 minutes AXIS I: Paranoid schizophrenia, chronic condition with acute exacerbation  Alcohol abuse  Cannabis abuse  AXIS II: Deferred  AXIS III:  Past Medical History   Diagnosis  Date   .  Schizophrenia    .  Hepatitis     AXIS IV: economic problems, educational problems, housing problems, occupational problems, other psychosocial or environmental problems and problems related to social environment  AXIS V: 31-40 impairment in reality testing   ADL's:  Intact  Sleep: Good  Appetite:  Good  Suicidal Ideation:  Denies Homicidal Ideation:  Denies AEB (as evidenced by):  Psychiatric Specialty Exam: Physical Exam  Review of Systems  Constitutional: Negative.   HENT: Negative.   Eyes: Negative.   Respiratory: Negative.   Cardiovascular: Negative.   Gastrointestinal: Negative.   Genitourinary: Negative.   Musculoskeletal: Negative.   Skin: Negative.   Neurological: Negative.   Endo/Heme/Allergies: Negative.   Psychiatric/Behavioral: Positive for depression, hallucinations and substance abuse (UDS positive for marijuana ). The patient is nervous/anxious.     Blood pressure 133/87,  pulse 89, temperature 97.8 F (36.6 C), temperature source Oral, resp. rate 20, height 5\' 6"  (1.676 m), weight 66.367 kg (146 lb 5 oz).Body mass index is 23.63 kg/(m^2).  General Appearance: Casual  Eye Contact::  Good  Speech:  Pressured  Volume:  Normal  Mood:  Irritable  Affect:  Congruent  Thought Process:  Circumstantial and Disorganized  Orientation:  Full (Time, Place, and Person)  Thought Content:  Delusions, Hallucinations: Auditory and Paranoid Ideation  Suicidal Thoughts:  No  Homicidal Thoughts:  No  Memory:  Immediate;   Fair Recent;   Fair Remote;   Fair  Judgement:  Impaired  Insight:  Lacking  Psychomotor Activity:  Normal  Concentration:  Fair  Recall:  [employer]Fair  Fund of Knowledge:Fair  Language: Good  Akathisia:  No  Handed:  Right  AIMS (if indicated):     Assets:  Communication Skills Desire for Improvement Physical Health  Sleep:  Number of Hours: 5.75   Musculoskeletal: Strength & Muscle Tone: within normal limits Gait & Station: normal Patient leans: N/A  Current Medications: Current Facility-Administered Medications  Medication Dose Route Frequency Provider Last Rate Last Dose  . acetaminophen (TYLENOL) tablet 650 mg  650 mg Oral Q6H PRN Kerry HoughSpencer E Simon, PA-C   650 mg at 01/03/14 1306  . alum & mag hydroxide-simeth (MAALOX/MYLANTA) 200-200-20 MG/5ML suspension 30 mL  30 mL Oral Q4H PRN Kerry HoughSpencer E Simon, PA-C      . amantadine (SYMMETREL) capsule 100 mg  100 mg Oral BID Mojeed Akintayo   100 mg at 01/03/14 0804  .  hydrALAZINE (APRESOLINE) tablet 10 mg  10 mg Oral 4 times per day Kerry HoughSpencer E Simon, PA-C   10 mg at 01/03/14 1305  . HYDROcodone-acetaminophen (NORCO/VICODIN) 5-325 MG per tablet 1 tablet  1 tablet Oral Q6H PRN Mojeed Akintayo   1 tablet at 01/03/14 0806  . ipratropium-albuterol (DUONEB) 0.5-2.5 (3) MG/3ML nebulizer solution 3 mL  3 mL Nebulization Q6H PRN Kerry HoughSpencer E Simon, PA-C      . lisinopril (PRINIVIL,ZESTRIL) tablet 10 mg  10 mg Oral Daily  Kerry HoughSpencer E Simon, PA-C   10 mg at 01/03/14 0804  . LORazepam (ATIVAN) tablet 2 mg  2 mg Oral Q6H PRN Mojeed Akintayo   2 mg at 01/02/14 0748  . magnesium hydroxide (MILK OF MAGNESIA) suspension 30 mL  30 mL Oral Daily PRN Kerry HoughSpencer E Simon, PA-C      . multivitamin with minerals tablet 1 tablet  1 tablet Oral Daily Kerry HoughSpencer E Simon, PA-C   1 tablet at 01/03/14 0804  . nicotine (NICODERM CQ - dosed in mg/24 hours) patch 21 mg  21 mg Transdermal Daily Mojeed Akintayo   21 mg at 01/03/14 0808  . pantoprazole (PROTONIX) EC tablet 40 mg  40 mg Oral Daily Kerry HoughSpencer E Simon, PA-C   40 mg at 01/03/14 16100803  . thiamine (B-1) injection 100 mg  100 mg Intramuscular Once IntelSpencer E Simon, PA-C      . thiamine (VITAMIN B-1) tablet 100 mg  100 mg Oral Daily Kerry HoughSpencer E Simon, PA-C   100 mg at 01/03/14 0804  . traZODone (DESYREL) tablet 100 mg  100 mg Oral QHS Mojeed Akintayo   100 mg at 01/02/14 2115    Lab Results: No results found for this or any previous visit (from the past 48 hour(s)).  Physical Findings: AIMS: Facial and Oral Movements Muscles of Facial Expression: Mild Lips and Perioral Area: Minimal Jaw: None, normal Tongue: None, normal,Extremity Movements Upper (arms, wrists, hands, fingers): None, normal Lower (legs, knees, ankles, toes): None, normal, Trunk Movements Neck, shoulders, hips: None, normal, Overall Severity Severity of abnormal movements (highest score from questions above): None, normal Incapacitation due to abnormal movements: None, normal Patient's awareness of abnormal movements (rate only patient's report): No Awareness, Dental Status Current problems with teeth and/or dentures?: No Does patient usually wear dentures?: No  CIWA:  CIWA-Ar Total: 0 COWS:     Treatment Plan Summary: Daily contact with patient to assess and evaluate symptoms and progress in treatment Medication management  Plan: 1. Continue crisis management and stabilization.  2. Medication management:       Will change Symmetrel to Cogenting to lower risk of prolonged QT.      ROR is signed and [facility]. 3. Encouraged patient to attend groups and participate in group counseling sessions and activities.  4. Discharge plan in progress.  5. Continue current treatment plan.  6. Address health issues: Continue Apresoline 10 every six hours for Hypertension. Vitals reviewed and WNL.   Medical Decision Making Problem Points:  Established problem, stable/improving (1), Review of last therapy session (1) and Review of psycho-social stressors (1) Data Points:  Order Aims Assessment (2) Review or order clinical lab tests (1) Review of medication regiment & side effects (2)  I certify that inpatient services furnished can reasonably be expected to improve the patient's condition.   Rona RavensNeil T. Mashburn RPAC 4:10 PM 01/03/2014 I agree with assessment and plan Madie RenoIrving A. Dub MikesLugo, M.D.

## 2014-01-03 NOTE — Progress Notes (Signed)
Patient ID: Christopher Grant, male   DOB: 07/18/1955, 58 y.o.   MRN: [id number] Christopher Grant presents with depressed mood, affect blunted again today. Christopher Grant continues to be focused solely on pain medications, dosages and times it is available. Christopher Grant reports he is continuing to have auditory hallucinations but was unable to elaborate with Clinical research associatewriter stating ''whispers or something like that. '' He has been attending unit programming. A. Medications given as ordered, including prn medication for pain. Discussed above information with N. Mashburn PA-C. R. Patient has been cooperative and in no acute distress at this time. Will continue to monitor q 15 minutes for safety.

## 2014-01-03 NOTE — Progress Notes (Signed)
[facility] Group Notes:  (Nursing/MHT/Case Management/Adjunct)  Date:  01/03/2014  Time:  10:25 PM  Type of Therapy:  Psychoeducational Skills  Participation Level:  Active  Participation Quality:  Appropriate  Affect:  Appropriate  Cognitive:  Appropriate  Insight:  Improving  Engagement in Group:  Improving  Modes of Intervention:  Education  Summary of Progress/Problems: The patient expressed in group this evening that he had a better day today for a number of reasons. First of all, he verbalized that he was pleased that some of his medications were switched. Secondly, he was upbeat over the fact that his blood pressure dropped to within normal limits. Finally, he acknowledged that the music group was beneficial to him. In terms of the theme for the day, he intends to use the coping skills that he learned in group for his needs but he would not narrow down which skills he would utilize.   Hazle CocaGOODMAN, Domique Clapper S 01/03/2014, 10:25 PM

## 2014-01-04 MED ORDER — BENZTROPINE MESYLATE 1 MG PO TABS
1.0000 mg | ORAL_TABLET | Freq: Every day | ORAL | Status: DC
  Administered 2014-01-05 – 2014-01-12 (×8): 1 mg via ORAL
  Filled 2014-01-04 (×5): qty 1
  Filled 2014-01-04: qty 14
  Filled 2014-01-04 (×5): qty 1

## 2014-01-04 MED ORDER — PALIPERIDONE ER 6 MG PO TB24
6.0000 mg | ORAL_TABLET | Freq: Every day | ORAL | Status: DC
  Administered 2014-01-04 – 2014-01-07 (×4): 6 mg via ORAL
  Filled 2014-01-04 (×5): qty 1

## 2014-01-04 NOTE — Tx Team (Signed)
  Interdisciplinary Treatment Plan Update   Date Reviewed:  01/04/2014  Time Reviewed:  8:34 AM  Progress in Treatment:   Attending groups: Yes Participating in groups: Yes Taking medication as prescribed: Yes  Tolerating medication: Yes Family/Significant other contact made: Yes  Patient understands diagnosis: Yes  Discussing patient identified problems/goals with staff: Yes Medical problems stabilized or resolved: Yes Denies suicidal/homicidal ideation: Yes Patient has not harmed self or others: Yes  For review of initial/current patient goals, please see plan of care.  Estimated Length of Stay:  4-5 days  Reason for Continuation of Hospitalization: Medication stabilization Other; describe Disorganized thinking with paranoia  New Problems/Goals identified:  N/A  Discharge Plan or Barriers:   states he plans to go the shelter, [facility]  Additional Comments: Patient states that he is doing the best that he can. He continues to ask for more pain medication in excess of his observed behavior. Patient is seen and chart reviewed. Patient is alert and oriented x 3, and cooperative. He makes good eye contact and his speech is clear and goal directed. He reports good reduction in symptoms and is concerned about housing and legal issues. He has attended some groups, denies SI/HI or AVH. The AH are decreasing, but denies VH.     Attendees:  Signature: Thedore MinsMojeed Akintayo, MD 01/04/2014 8:34 AM   Signature: Richelle Itood Marcellus Pulliam, LCSW 01/04/2014 8:34 AM  Signature: Fransisca KaufmannLaura Davis, NP 01/04/2014 8:34 AM  Signature: Joslyn Devonaroline Beaudry, RN 01/04/2014 8:34 AM  Signature: Liborio NixonPatrice White, RN 01/04/2014 8:34 AM  Signature:  01/04/2014 8:34 AM  Signature:   01/04/2014 8:34 AM  Signature:    Signature:    Signature:    Signature:    Signature:    Signature:      Scribe for Treatment Team:   Richelle Itood Razi Hickle, LCSW  01/04/2014 8:34 AM
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
## 2014-01-04 NOTE — Progress Notes (Signed)
Child/Adolescent Psychoeducational Group Note  Date:  01/04/2014 Time:  9:47 PM  Group Topic/Focus:  Wrap-Up Group:   The focus of this group is to help patients review their daily goal of treatment and discuss progress on daily workbooks.  Participation Level:  Active  Participation Quality:  Appropriate and Attentive  Affect:  Appropriate  Cognitive:  Appropriate  Insight:  Appropriate  Engagement in Group:  Engaged  Modes of Intervention:  Discussion  Additional Comments:  Pt attended the wrap up group this evening and remained appropriate and engaged throughout the duration of the group. Pt ranked his day as an 8 because his day was satisfactory. Pt shared that one positive thing about his day was that he got to have good conversation with people.  Sheran Lawlesseese, Morgin Halls O 01/04/2014, 9:47 PM

## 2014-01-04 NOTE — Progress Notes (Signed)
NSG shift assessment. 7a-7p.   D: Affect blunted, mood depressed, behavior appropriate. Attends groups and participates. In Self Inventory he rated his depression as 7/10 and feelings of hopelessness as 7/10. He reports some agitation and has frequent complaints of chronic pain with medication-seeking behavior for that. When he returns home he plans to, "Be responsible for just me." Cooperative with staff and is getting along well with peers.   A: Observed pt interacting in group and in the milieu: Support and encouragement offered. Safety maintained with observations every 15 minutes.   R:   Contracts for safety and continues to follow the treatment plan, working on learning new coping skills.

## 2014-01-04 NOTE — [facility] Group Notes (Signed)
[facility] LCSW Group Therapy  01/04/2014 1:15 pm  Type of Therapy: Process Group Therapy  Participation Level:  Active  Participation Quality:  Appropriate  Affect:  Flat  Cognitive:  Oriented  Insight:  Improving  Engagement in Group:  Limited  Engagement in Therapy:  Limited  Modes of Intervention:  Activity, Clarification, Education, Problem-solving and Support  Summary of Progress/Problems: Today's group addressed the issue of overcoming obstacles.  Patients were asked to identify their biggest obstacle post d/c that stands in the way of their on-going success, and then problem solve as to how to manage this.  I need some answers."  Christopher Grant responds to the question in a vague, general way that results in no further understanding nor clarification.  States he has no family other than "my so called sister."  Christopher Grant, Christopher Grant B 01/04/2014   3:05 PM
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
## 2014-01-05 NOTE — [facility] Group Notes (Signed)
[facility] Group Notes:  (Nursing/MHT/Case Management/Adjunct)  Date:  01/05/2014  Time:  0900  Type of Therapy:  Psychoeducational Skills  Participation Level:  Minimal  Participation Quality:  Attentive  Affect:  Appropriate  Cognitive:  Alert and Appropriate  Insight:  Improving  Engagement in Group:  Supportive  Modes of Intervention:  Clarification and Support  Summary of Progress/Problems:Nursing wellness/ Tuesday recovery   Andres Egeritchett, Tyrone Balash Hundley 01/05/2014, 4:26 PM

## 2014-01-05 NOTE — [facility] Group Notes (Signed)
[facility] LCSW Group Therapy  01/05/2014 , 1:30 PM   Type of Therapy:  Group Therapy  Participation Level:  Active  Participation Quality:  Attentive  Affect:  Appropriate  Cognitive:  Alert  Insight:  Improving  Engagement in Therapy:  Engaged  Modes of Intervention:  Discussion, Exploration and Socialization  Summary of Progress/Problems: Today's group focused on the term Diagnosis.  Participants were asked to define the term, and then pronounce whether it is a negative, positive or neutral term.  Christopher Grant stayed for the entire group.  He sat quietly and did not contribute to the conversation.  Christopher Grant, Christopher Grant 01/05/2014 , 1:30 PM

## 2014-01-05 NOTE — [facility] Group Notes (Signed)
Adult Psychoeducational Group Note  Date:  01/05/2014 Time:  9:57 PM  Group Topic/Focus:  Wrap-Up Group:   The focus of this group is to help patients review their daily goal of treatment and discuss progress on daily workbooks.  Participation Level:  None  Participation Quality:  Attentive  Affect:  Flat  Cognitive:  Alert  Insight: None  Engagement in Group:  None  Modes of Intervention:  Discussion  Additional Comments:  Ethelene Brownsnthony attended group but did not engage in group.  Caroll RancherLindsay, Koda Routon A 01/05/2014, 9:57 PM

## 2014-01-05 NOTE — Progress Notes (Signed)
Patient ID: Christopher Grant, male   DOB: 06/08/1956, 58 y.o.   MRN: [id number]  D: Patient has a flat affect on approach today. Reports depression "7" and feelings of hopelessness "6" today. Still reports some hallucinations at times. Currently denies any SI/HI. Reports doesn't feel that medication has helped with his hallucinations much at this time.. A: Staff will monitor on q 15 minute checks, follow treatment plan, and give meds as ordered. R: Cooperative on the unit. Taking medication without issue.

## 2014-01-06 LAB — HEMOGLOBIN A1C
HEMOGLOBIN A1C: 5.2 % (ref <5.7)
MEAN PLASMA GLUCOSE: 103 mg/dL (ref <117)

## 2014-01-06 LAB — LIPID PANEL
CHOL/HDL RATIO: 2.1 ratio
Cholesterol: 184 mg/dL (ref 0–200)
HDL: 88 mg/dL (ref >39)
LDL Cholesterol: 61 mg/dL (ref 0–99)
Triglycerides: 173 mg/dL — ABNORMAL HIGH (ref <150)
VLDL: 35 mg/dL (ref 0–40)

## 2014-01-06 NOTE — Progress Notes (Signed)
Pt is lying in bed awake, talking with his roommate.  They are having a pleasant conversation, saying that they need to stay in their room for a while because of the loud chaos in the hallway.  Pt was was appropriate in his conversation with Clinical research associatewriter and said that he was having a good day in spite of his chronic pain.  Pt denies SI/HI, but says he is still hearing voices.  He says the medication is helping some.  Pt is unsure when he will discharge or where he will go from here.  Pt makes his needs known to staff.  Support and encouragement offered.  Pt has been going to some groups.  Safety maintained with q15 minute checks.

## 2014-01-06 NOTE — [facility] Group Notes (Signed)
[facility]   01/06/2014 11:36 AM  Participation Quality:  Did not attend    [place], Christopher Grant

## 2014-01-06 NOTE — Progress Notes (Signed)
Pt observed lying awake in his bed.  When writer entered the room, pt was talking to himself as if he was carrying on a conversation with someone in the room.  When pt noticed writer in the room, his affect changed and he was able to have a normal conversation with Clinical research associatewriter.  He says he had a good day.  He is unsure when he will be discharged.  He acknowledged that he was still hearing voices, but they were tolerable and not disturbing to him.  Pt says he has gone to some groups today.  Pt makes his needs known to staff.  He is pleasant/cooperative with staff.  Support and encouragement offered.  Safety maintained with q15 minute checks.

## 2014-01-06 NOTE — Progress Notes (Signed)
Adult Psychoeducational Group Note  Date:  01/06/2014 Time:  9:19 PM  Group Topic/Focus:  Wrap-Up Group:   The focus of this group is to help patients review their daily goal of treatment and discuss progress on daily workbooks.  Participation Level:  Active  Participation Quality:  Appropriate and Attentive  Affect:  Appropriate  Cognitive:  Appropriate  Insight: Appropriate and Good  Engagement in Group:  Engaged  Modes of Intervention:  Discussion  Additional Comments:  Pt attended the wrap up group this evening and remained appropriate and engaged throughout the duration of the group. Pt ranked his day as a 6 because he had a good day. Pt stated that one positive aspect about his day was that he didn't lose his cool at any point.  Sheran Lawlesseese, Nilay Mangrum O 01/06/2014, 9:19 PM

## 2014-01-06 NOTE — [facility] Group Notes (Signed)
[facility]  01/06/2014 , 11:36 AM    Type of Therapy:  [employer] Presentation  Participation Level:  Active  Participation Quality:  Attentive  Affect:  Blunted  Cognitive:  Oriented  Insight:  Limited  Engagement in Therapy:  Engaged  Modes of Intervention:  Discussion, Education and Socialization  Summary of Progress/Problems:  Onalee HuaDavid from [employer] came to present his recovery story and play the guitar.  Sat quietly throughout.  Got up and poured coffee, spilled some but cleaned it up thoroughly.  Daryel Geraldorth, Raysean Graumann B 01/06/2014 , 11:36 AM

## 2014-01-06 NOTE — Progress Notes (Signed)
Patient ID: Christopher Grant, male   DOB: 07/28/1955, 58 y.o.   MRN: [id number]  D: Patient pleasant on approach this am. Gives depression "8" and hopeless feelings "7". Currently denies any SI. Still hears voices. Patient often seen talking to self when he is in the room alone. Reports the voices do bother him but the medications have only helped some thus far. No withdrawals from the previous alcohol detox.  A: Staff will monitor on q 15 minute checks, follow treatment plan, and give meds as ordered. R: Cooperative on the unit.

## 2014-01-06 NOTE — Progress Notes (Signed)
Patient ID: Christopher Grant, male   DOB: 05/30/1956, 58 y.o.   MRN: [id number]    [facility]  01/06/2014 3:34 PM Christopher Grant  MRN:  [id number] Subjective:  Patient states "I am still hearing voices but they aren't really bothering me. I can't tell what they are saying, just saying stuff here and there".   Objective:  Patient is seen and chart reviewed. He is experiencing ongoing psychotic symptoms of paranoia, delusions, and auditory hallucinations. Patient reports to staff that the voices tell him bad stuff. They are not command in nature. Patient ruminates at length when prompted to discuss the delusions about his family. The patient appears to have no insight into his mental illness. Patient was started on Invega yesterday and has been compliant with his medications. The patient reports having a history of Diabetes and would like this to be assessed to determine need for treatment.   Diagnosis:   DSM5: Total Time spent with patient: 25 minutes   AXIS I: Paranoid schizophrenia, chronic condition with acute exacerbation  Alcohol abuse  Cannabis abuse  AXIS II: Deferred  AXIS III:  Past Medical History   Diagnosis  Date   .  Schizophrenia    .  Hepatitis     AXIS IV: economic problems, educational problems, housing problems, occupational problems, other psychosocial or environmental problems and problems related to social environment  AXIS V: 41-50 Serious Symptoms   ADL's:  Intact  Sleep: Good  Appetite:  Good  Suicidal Ideation:  Denies Homicidal Ideation:  Denies AEB (as evidenced by):  Psychiatric Specialty Exam: Physical Exam  Review of Systems  Constitutional: Negative.   HENT: Negative.   Eyes: Negative.   Respiratory: Negative.   Cardiovascular: Negative.   Gastrointestinal: Negative.   Genitourinary: Negative.   Musculoskeletal: Negative.   Skin: Negative.   Neurological: Negative.   Endo/Heme/Allergies: Negative.   Psychiatric/Behavioral:  Positive for depression, hallucinations and substance abuse (UDS positive for marijuana ). The patient is nervous/anxious.     Blood pressure 122/74, pulse 100, temperature 98.2 F (36.8 C), temperature source Oral, resp. rate 18, height 5\' 6"  (1.676 m), weight 66.367 kg (146 lb 5 oz).Body mass index is 23.63 kg/(m^2).  General Appearance: Casual  Eye Contact::  Good  Speech:  Pressured  Volume:  Normal  Mood:  Angry  Affect:  Labile  Thought Process:  Circumstantial and Disorganized  Orientation:  Full (Time, Place, and Person)  Thought Content:  Delusions, Hallucinations: Auditory and Paranoid Ideation  Suicidal Thoughts:  No  Homicidal Thoughts:  No  Memory:  Immediate;   Fair Recent;   Fair Remote;   Fair  Judgement:  Impaired  Insight:  Lacking  Psychomotor Activity:  Normal  Concentration:  Fair  Recall:  [employer]Fair  Fund of Knowledge:Fair  Language: Good  Akathisia:  No  Handed:  Right  AIMS (if indicated):     Assets:  Communication Skills Desire for Improvement Physical Health Resilience  Sleep:  Number of Hours: 5.58   Musculoskeletal: Strength & Muscle Tone: within normal limits Gait & Station: normal Patient leans: N/A  Current Medications: Current Facility-Administered Medications  Medication Dose Route Frequency Provider Last Rate Last Dose  . acetaminophen (TYLENOL) tablet 650 mg  650 mg Oral Q6H PRN Kerry HoughSpencer E Simon, PA-C   650 mg at 01/05/14 [id number]  . alum & mag hydroxide-simeth (MAALOX/MYLANTA) 200-200-20 MG/5ML suspension 30 mL  30 mL Oral Q4H PRN Kerry HoughSpencer E Simon, PA-C      .  benztropine (COGENTIN) tablet 1 mg  1 mg Oral Daily Fransisca Kaufmann, NP   1 mg at 01/06/14 0827  . hydrALAZINE (APRESOLINE) tablet 10 mg  10 mg Oral 4 times per day Kerry Hough, PA-C   10 mg at 01/06/14 1137  . HYDROcodone-acetaminophen (NORCO/VICODIN) 5-325 MG per tablet 1 tablet  1 tablet Oral Q6H PRN Koraima Albertsen   1 tablet at 01/06/14 1137  . ipratropium-albuterol (DUONEB) 0.5-2.5  (3) MG/3ML nebulizer solution 3 mL  3 mL Nebulization Q6H PRN Kerry Hough, PA-C      . lisinopril (PRINIVIL,ZESTRIL) tablet 10 mg  10 mg Oral Daily Kerry Hough, PA-C   10 mg at 01/06/14 0827  . magnesium hydroxide (MILK OF MAGNESIA) suspension 30 mL  30 mL Oral Daily PRN Kerry Hough, PA-C      . multivitamin with minerals tablet 1 tablet  1 tablet Oral Daily Kerry Hough, PA-C   1 tablet at 01/06/14 0827  . nicotine (NICODERM CQ - dosed in mg/24 hours) patch 21 mg  21 mg Transdermal Daily Airlie Blumenberg   21 mg at 01/06/14 0828  . paliperidone (INVEGA) 24 hr tablet 6 mg  6 mg Oral Daily Fransisca Kaufmann, NP   6 mg at 01/06/14 0827  . pantoprazole (PROTONIX) EC tablet 40 mg  40 mg Oral Daily Kerry Hough, PA-C   40 mg at 01/06/14 0827  . thiamine (B-1) injection 100 mg  100 mg Intramuscular Once [employer], PA-C      . thiamine (VITAMIN B-1) tablet 100 mg  100 mg Oral Daily Kerry Hough, PA-C   100 mg at 01/06/14 0827  . traZODone (DESYREL) tablet 100 mg  100 mg Oral QHS Jayana Kotula   100 mg at 01/05/14 2317    Lab Results:  Results for orders placed during the hospital encounter of 12/29/13 (from the past 48 hour(s))  LIPID PANEL     Status: Abnormal   Collection Time    01/06/14  6:20 AM      Result Value Ref Range   Cholesterol 184  0 - 200 mg/dL   Triglycerides 536 (*) <150 mg/dL   HDL 88  >64 mg/dL   Total CHOL/HDL Ratio 2.1     VLDL 35  0 - 40 mg/dL   LDL Cholesterol 61  0 - 99 mg/dL   Comment:            Total Cholesterol/HDL:CHD Risk     Coronary Heart Disease Risk Table                         Men   Women      1/2 Average Risk   3.4   3.3      Average Risk       5.0   4.4      2 X Average Risk   9.6   7.1      3 X Average Risk  23.4   11.0                Use the calculated Patient Ratio     above and the CHD Risk Table     to determine the patient's CHD Risk.                ATP III CLASSIFICATION (LDL):      <100     mg/dL   Optimal  100-129  mg/dL   Near or Above                        Optimal      130-159  mg/dL   Borderline      [id number]  mg/dL   High      >098>190     mg/dL   Very High     [facility]  HEMOGLOBIN A1C     Status: None   Collection Time    01/06/14  6:20 AM      Result Value Ref Range   Hemoglobin A1C 5.2  <5.7 %   Comment: (NOTE)                                                                               According to the ADA Clinical Practice Recommendations for 2011, when     HbA1c is used as a screening test:      >=6.5%   Diagnostic of Diabetes Mellitus               (if abnormal result is confirmed)     5.7-6.4%   Increased risk of developing Diabetes Mellitus     References:Diagnosis and Classification of Diabetes Mellitus,Diabetes     Care,2011,34(Suppl 1):S62-S69 and Standards of Medical Care in             Diabetes - 2011,Diabetes Care,2011,34 (Suppl 1):S11-S61.   Mean Plasma Glucose 103  <117 mg/dL   Comment: Performed at [facility]    Physical Findings: AIMS: Facial and Oral Movements Muscles of Facial Expression: None, normal Lips and Perioral Area: None, normal Jaw: None, normal Tongue: None, normal,Extremity Movements Upper (arms, wrists, hands, fingers): None, normal Lower (legs, knees, ankles, toes): None, normal, Trunk Movements Neck, shoulders, hips: None, normal, Overall Severity Severity of abnormal movements (highest score from questions above): None, normal Incapacitation due to abnormal movements: None, normal Patient's awareness of abnormal movements (rate only patient's report): No Awareness, Dental Status Current problems with teeth and/or dentures?: No Does patient usually wear dentures?: No  CIWA:  CIWA-Ar Total: 0 COWS:     Treatment Plan Summary: Daily contact with patient to assess and evaluate symptoms and progress in treatment Medication management  Plan:  1. Continue crisis management and stabilization.  2. Medication  management:   -Continue Invega 6 mg daily for psychotic symptoms.  -Continue Cogentin to 1 mg daily for EPS prevention.  3. Encouraged patient to attend groups and participate in group counseling sessions and activities.  4. Discharge plan in progress.  5. Continue current treatment plan.  6. Address health issues: Continue Apresoline 10 every six hours for Hypertension. Vitals reviewed and WNL. Order Hemoglobin A1c to assess for Diabetes.   Medical Decision Making Problem Points:  Established problem, stable/improving (1), Review of last therapy session (1) and Review of psycho-social stressors (1) Data Points:  Order Aims Assessment (2) Review or order clinical lab tests (1) Review of medication regiment & side effects (2)  I certify that inpatient services furnished can reasonably be expected to improve the patient's condition.   Beau FannyJohn C Withrow, FNP-BC  3:34 PM  01/06/2014  Patient seen, evaluated and I agree with notes by Nurse Practitioner. Thedore Mins, MD

## 2014-01-07 DIAGNOSIS — F1210 Cannabis abuse, uncomplicated: Secondary | ICD-10-CM

## 2014-01-07 DIAGNOSIS — F1010 Alcohol abuse, uncomplicated: Secondary | ICD-10-CM

## 2014-01-07 DIAGNOSIS — F200 Paranoid schizophrenia: Principal | ICD-10-CM

## 2014-01-07 MED ORDER — PALIPERIDONE ER 6 MG PO TB24
9.0000 mg | ORAL_TABLET | Freq: Every day | ORAL | Status: DC
  Administered 2014-01-08 – 2014-01-12 (×5): 9 mg via ORAL
  Filled 2014-01-07 (×8): qty 1

## 2014-01-07 MED ORDER — HYDROCODONE-ACETAMINOPHEN 5-325 MG PO TABS
1.5000 | ORAL_TABLET | Freq: Four times a day (QID) | ORAL | Status: DC | PRN
Start: 2014-01-07 — End: 2014-01-13
  Administered 2014-01-07 – 2014-01-12 (×15): 1.5 via ORAL
  Filled 2014-01-07 (×14): qty 2
  Filled 2014-01-07 (×2): qty 1

## 2014-01-07 MED ORDER — HYDROCODONE-ACETAMINOPHEN 7.5-325 MG PO TABS: 1.0000 | ORAL_TABLET | Freq: Four times a day (QID) | ORAL | Status: DC | PRN

## 2014-01-07 MED ORDER — TRAZODONE HCL 150 MG PO TABS
150.0000 mg | ORAL_TABLET | Freq: Every day | ORAL | Status: DC
  Administered 2014-01-07 – 2014-01-12 (×5): 150 mg via ORAL
  Filled 2014-01-07 (×4): qty 1
  Filled 2014-01-07: qty 14
  Filled 2014-01-07 (×5): qty 1

## 2014-01-07 NOTE — Progress Notes (Signed)
Patient ID: Christopher Grant, male   DOB: 03/10/1956, 58 y.o.   MRN: [id number]    [facility]  01/07/2014 10:36 AM Christopher Grant  MRN:  [id number] Subjective:  Patient states "I am still hearing voices in my brain talking to each other but they aren't really bothering me. I am only worry about my body pain, if you can help me with that.'' Objective:  Patient is seen and his chart is reviewed. He is endorsing hearing multiple voices having conversations with one another but denies visual hallucinations. Patient continues to harbor a fixed delusions about his family. He believes they are conspiring to steal his money and does not want anything to do with them anymore. Patient has been observed by the staff talking to himself as if responding to internal stimuli. Patient also endorsed racing thoughts, worries and difficulty sleeping. However, he has been compliant with his medications and has not verbalized any adverse reactions.  Diagnosis:   DSM5: Total Time spent with patient: 25 minutes   AXIS I: Paranoid schizophrenia, chronic condition with acute exacerbation  Alcohol abuse  Cannabis abuse  AXIS II: Deferred  AXIS III:  Past Medical History   Diagnosis  Date   .  Chronic Pain   .  Hepatitis     AXIS IV: economic problems, educational problems, housing problems, occupational problems, other psychosocial or environmental problems and problems related to social environment  AXIS V: 41-50 Serious Symptoms   ADL's:  Intact  Sleep: Good  Appetite:  Good  Suicidal Ideation:  Denies Homicidal Ideation:  Denies AEB (as evidenced by):  Psychiatric Specialty Exam: Physical Exam  Review of Systems  Constitutional: Negative.   HENT: Negative.   Eyes: Negative.   Respiratory: Negative.   Cardiovascular: Negative.   Gastrointestinal: Negative.   Genitourinary: Negative.   Musculoskeletal: Negative.   Skin: Negative.   Neurological: Negative.   Endo/Heme/Allergies:  Negative.   Psychiatric/Behavioral: Positive for depression, hallucinations and substance abuse (UDS positive for marijuana ). The patient is nervous/anxious.     Blood pressure 116/77, pulse 71, temperature 98.9 F (37.2 C), temperature source Oral, resp. rate 18, height 5\' 6"  (1.676 m), weight 66.367 kg (146 lb 5 oz).Body mass index is 23.63 kg/(m^2).  General Appearance: Casual  Eye Contact::  Good  Speech:  Pressured  Volume:  Normal  Mood:  Angry  Affect:  Labile  Thought Process:  Circumstantial and Disorganized  Orientation:  Full (Time, Place, and Person)  Thought Content:  Delusions, Hallucinations: Auditory and Paranoid Ideation  Suicidal Thoughts:  No  Homicidal Thoughts:  No  Memory:  Immediate;   Fair Recent;   Fair Remote;   Fair  Judgement:  Impaired  Insight:  Lacking  Psychomotor Activity:  Normal  Concentration:  Fair  Recall:  [employer]Fair  Fund of Knowledge:Fair  Language: Good  Akathisia:  No  Handed:  Right  AIMS (if indicated):     Assets:  Communication Skills Desire for Improvement Physical Health Resilience  Sleep:  Number of Hours: 3.5   Musculoskeletal: Strength & Muscle Tone: within normal limits Gait & Station: normal Patient leans: N/A  Current Medications: Current Facility-Administered Medications  Medication Dose Route Frequency Provider Last Rate Last Dose  . acetaminophen (TYLENOL) tablet 650 mg  650 mg Oral Q6H PRN Kerry HoughSpencer E Simon, PA-C   650 mg at 01/05/14 [id number]  . alum & mag hydroxide-simeth (MAALOX/MYLANTA) 200-200-20 MG/5ML suspension 30 mL  30 mL Oral Q4H PRN Kerry HoughSpencer E Simon, PA-C      .  benztropine (COGENTIN) tablet 1 mg  1 mg Oral Daily Fransisca Kaufmann, NP   1 mg at 01/07/14 0903  . hydrALAZINE (APRESOLINE) tablet 10 mg  10 mg Oral 4 times per day Kerry Hough, PA-C   10 mg at 01/07/14 0552  . HYDROcodone-acetaminophen (NORCO) 7.5-325 MG per tablet 1 tablet  1 tablet Oral Q6H PRN Oliver Heitzenrater      . ipratropium-albuterol (DUONEB)  0.5-2.5 (3) MG/3ML nebulizer solution 3 mL  3 mL Nebulization Q6H PRN Kerry Hough, PA-C      . lisinopril (PRINIVIL,ZESTRIL) tablet 10 mg  10 mg Oral Daily Kerry Hough, PA-C   10 mg at 01/07/14 0902  . magnesium hydroxide (MILK OF MAGNESIA) suspension 30 mL  30 mL Oral Daily PRN Kerry Hough, PA-C      . multivitamin with minerals tablet 1 tablet  1 tablet Oral Daily Kerry Hough, PA-C   1 tablet at 01/07/14 0902  . nicotine (NICODERM CQ - dosed in mg/24 hours) patch 21 mg  21 mg Transdermal Daily Christina Waldrop   21 mg at 01/07/14 0901  . [START ON 01/08/2014] paliperidone (INVEGA) 24 hr tablet 9 mg  9 mg Oral Daily Jamyra Zweig      . pantoprazole (PROTONIX) EC tablet 40 mg  40 mg Oral Daily Kerry Hough, PA-C   40 mg at 01/07/14 0902  . thiamine (VITAMIN B-1) tablet 100 mg  100 mg Oral Daily Kerry Hough, PA-C   100 mg at 01/07/14 1610  . traZODone (DESYREL) tablet 150 mg  150 mg Oral QHS Abdulraheem Pineo        Lab Results:  Results for orders placed during the hospital encounter of 12/29/13 (from the past 48 hour(s))  LIPID PANEL     Status: Abnormal   Collection Time    01/06/14  6:20 AM      Result Value Ref Range   Cholesterol 184  0 - 200 mg/dL   Triglycerides 960 (*) <150 mg/dL   HDL 88  >45 mg/dL   Total CHOL/HDL Ratio 2.1     VLDL 35  0 - 40 mg/dL   LDL Cholesterol 61  0 - 99 mg/dL   Comment:            Total Cholesterol/HDL:CHD Risk     Coronary Heart Disease Risk Table                         Men   Women      1/2 Average Risk   3.4   3.3      Average Risk       5.0   4.4      2 X Average Risk   9.6   7.1      3 X Average Risk  23.4   11.0                Use the calculated Patient Ratio     above and the CHD Risk Table     to determine the patient's CHD Risk.                ATP III CLASSIFICATION (LDL):      <100     mg/dL   Optimal      409-811  mg/dL   Near or Above  Optimal      130-159  mg/dL   Borderline       161-096  mg/dL   High      >045     mg/dL   Very High     Performed at [facility]  HEMOGLOBIN A1C     Status: None   Collection Time    01/06/14  6:20 AM      Result Value Ref Range   Hemoglobin A1C 5.2  <5.7 %   Comment: (NOTE)                                                                               According to the ADA Clinical Practice Recommendations for 2011, when     HbA1c is used as a screening test:      >=6.5%   Diagnostic of Diabetes Mellitus               (if abnormal result is confirmed)     5.7-6.4%   Increased risk of developing Diabetes Mellitus     References:Diagnosis and Classification of Diabetes Mellitus,Diabetes     Care,2011,34(Suppl 1):S62-S69 and Standards of Medical Care in             Diabetes - 2011,Diabetes Care,2011,34 (Suppl 1):S11-S61.   Mean Plasma Glucose 103  <117 mg/dL   Comment: Performed at [facility]    Physical Findings: AIMS: Facial and Oral Movements Muscles of Facial Expression: None, normal Lips and Perioral Area: None, normal Jaw: None, normal Tongue: None, normal,Extremity Movements Upper (arms, wrists, hands, fingers): None, normal Lower (legs, knees, ankles, toes): None, normal, Trunk Movements Neck, shoulders, hips: None, normal, Overall Severity Severity of abnormal movements (highest score from questions above): None, normal Incapacitation due to abnormal movements: None, normal Patient's awareness of abnormal movements (rate only patient's report): No Awareness, Dental Status Current problems with teeth and/or dentures?: No Does patient usually wear dentures?: No  CIWA:  CIWA-Ar Total: 0 COWS:     Treatment Plan Summary: Daily contact with patient to assess and evaluate symptoms and progress in treatment Medication management  Plan:  1. Continue crisis management and stabilization.  2. Medication management:   -Increase  Invega to 9mg  daily for delusions/psychotic symptoms.  -Continue Cogentin to  1 mg daily for EPS prevention.  -Increase Trazodone to 150mg  po hs for insomnia. -Increase Norco to 7.5/325mg  q6 prn for chronic pain. 3. Encouraged patient to attend groups and participate in group counseling sessions and activities.  4. Discharge plan in progress.  5. Continue current treatment plan.  6. Address health issues: Continue Apresoline 10 every six hours for Hypertension. Vitals reviewed and WNL. Order Hemoglobin A1c to assess for Diabetes.   Medical Decision Making Problem Points:  Established problem, stable/improving (1), Review of last therapy session (1) and Review of psycho-social stressors (1) Data Points:  Order Aims Assessment (2) Review or order clinical lab tests (1) Review of medication regiment & side effects (2)  I certify that inpatient services furnished can reasonably be expected to improve the patient's condition.   Thedore Mins, MD 10:36 AM 01/07/2014

## 2014-01-07 NOTE — [facility] Group Notes (Signed)
[facility] Group Notes:  (Nursing/MHT/Case Management/Adjunct)  Date:  01/07/2014  Time:  10:16 AM  Type of Therapy:  Nurse Education  Participation Level:  None  Participation Quality:  Inattentive  Affect:  Blunted  Cognitive:  Alert  Insight:  None  Engagement in Group:  Poor  Modes of Intervention:  Discussion  Summary of Progress/Problems:  [name], Christopher Grant 01/07/2014, 10:16 AM

## 2014-01-07 NOTE — Progress Notes (Signed)
Patient ID: Christopher Grant, male   DOB: 08/24/1955, 58 y.o.   MRN: [id number]  D: Pt. Denies SI/HI and A/V Hallucinations. Patient rates his sleep was fair last night but his energy level is normal. He reports that overall today he feels, "fair." Patient does report pain that is chronic and generalized. Writer will continue to monitor patient's pain and administer pain medication when appropriate. Patient rates his depression and hopelessness at 7/10 for the day.  A: Support and encouragement provided to the patient to speak to writer about any questions or concerns. Scheduled medications are administered to patient per physician's orders. Patient is taking medications with no reported or observed adverse effects.  R: Patient is receptive and cooperative but can be irritable at times. Patient is seen in the milieu at times or sleeping in his bed. Q15 minute checks are maintained for safety.

## 2014-01-07 NOTE — Tx Team (Signed)
  Interdisciplinary Treatment Plan Update   Date Reviewed:  01/07/2014  Time Reviewed:  5:51 PM  Progress in Treatment:   Attending groups: Yes Participating in groups: Minimally Taking medication as prescribed: Yes  Tolerating medication: Yes Family/Significant other contact made: Yes  Patient understands diagnosis: Yes  Discussing patient identified problems/goals with staff: Yes Medical problems stabilized or resolved: Yes Denies suicidal/homicidal ideation: Yes Patient has not harmed self or others: Yes  For review of initial/current patient goals, please see plan of care.  Estimated Length of Stay:  4-5 days  Reason for Continuation of Hospitalization: Hallucinations Medication stabilization Other; describe Disorganization, paranoia  New Problems/Goals identified:  N/A  Discharge Plan or Barriers:   return home, follow up outpt  Additional Comments:  "I am still hearing voices in my brain talking to each other but they aren't really bothering me. I am only worry about my body pain, if you can help me with that.'' Christopher Grant is endorsing hearing multiple voices having conversations with one another but denies visual hallucinations. Patient continues to harbor a fixed delusions about his family. He believes they are conspiring to steal his money and does not want anything to do with them anymore. Patient has been observed by the staff talking to himself as if responding to internal stimuli. Patient also endorsed racing thoughts, worries and difficulty sleeping. However, he has been compliant with his medications and has not verbalized any adverse reactions.   Attendees:  Signature: Thedore MinsMojeed Akintayo, MD 01/07/2014 5:51 PM   Signature: Christopher Itood Mekisha Bittel, LCSW 01/07/2014 5:51 PM  Signature: Fransisca KaufmannLaura Davis, NP 01/07/2014 5:51 PM  Signature: Joslyn Devonaroline Beaudry, RN 01/07/2014 5:51 PM  Signature: Liborio NixonPatrice White, RN 01/07/2014 5:51 PM  Signature:  01/07/2014 5:51 PM  Signature:   01/07/2014 5:51 PM  Signature:     Signature:    Signature:    Signature:    Signature:    Signature:      Scribe for Treatment Team:   Christopher Itood Alisa Stjames, LCSW  01/07/2014 5:51 PM

## 2014-01-07 NOTE — [facility] Group Notes (Signed)
[facility] Group Notes:  (Counselor/Nursing/MHT/Case Management/Adjunct)  01/07/2014 1:15PM  Type of Therapy:  Group Therapy  Participation Level:  Active  Participation Quality:  Appropriate  Affect:  Flat  Cognitive:  Oriented  Insight:  Improving  Engagement in Group:  Limited  Engagement in Therapy:  Limited  Modes of Intervention:  Discussion, Exploration and Socialization  Summary of Progress/Problems: The topic for group was balance in life.  Pt participated in the discussion about when their life was in balance and out of balance and how this feels.  Pt discussed ways to get back in balance and short term goals they can work on to get where they want to be. "I am unbalanced, but I think if I stay focused, I can get there.  I'm going to get there."  Per usual, pt is vague and unable to give specifics.  Mood is good.   Daryel Geraldorth, Harley Mccartney B 01/07/2014 4:21 PM

## 2014-01-08 NOTE — Progress Notes (Signed)
Adult Psychoeducational Group Note  Date:  01/08/2014 Time:  9:09 PM  Group Topic/Focus:  Wrap-Up Group:   The focus of this group is to help patients review their daily goal of treatment and discuss progress on daily workbooks.  Participation Level:  Active  Participation Quality:  Supportive  Affect:  Appropriate  Cognitive:  Alert and Appropriate  Insight: Good  Engagement in Group:  Supportive  Modes of Intervention:  Activity  Additional Comments:  Pt came to group and was engaged in the conversation during group... Pt spoke about what he wants to do when he leaves   Antigone Crowell R 01/08/2014, 9:09 PM

## 2014-01-08 NOTE — Progress Notes (Signed)
Patient ID: Christopher Grant, male   DOB: 07/19/1955, 58 y.o.   MRN: [id number]    [facility]  01/08/2014 11:29 AM Christopher Grant  MRN:  [id number] Subjective:  Patient states "I'm not hearing voices as much. I just need to get away from certain family members. I need to start over. Find some people I can deal with. I have not been on medicine for some time. I will stay on my medications."   Objective:  Patient is seen and his chart is reviewed. He is reporting a decrease in auditory hallucinations since his Hinda Glatternvega was increased. Patient continues to harbor a fixed delusion about his family. He believes they are conspiring to steal his money and does not want anything to do with them anymore. Patient has been observed by the staff talking to himself as if responding to internal stimuli. Patient also endorsed racing thoughts, worries and difficulty sleeping. However, he has been compliant with his medications and has not verbalized any adverse reactions.  Diagnosis:   DSM5: Total Time spent with patient: 20 minutes   AXIS I: Paranoid schizophrenia, chronic condition with acute exacerbation  Alcohol abuse  Cannabis abuse  AXIS II: Deferred  AXIS III:  Past Medical History   Diagnosis  Date   .  Chronic Pain   .  Hepatitis     AXIS IV: economic problems, educational problems, housing problems, occupational problems, other psychosocial or environmental problems and problems related to social environment  AXIS V: 41-50 Serious Symptoms   ADL's:  Intact  Sleep: Good  Appetite:  Good  Suicidal Ideation:  Denies Homicidal Ideation:  Denies AEB (as evidenced by):  Psychiatric Specialty Exam: Physical Exam  Review of Systems  Constitutional: Negative.   HENT: Negative.   Eyes: Negative.   Respiratory: Negative.   Cardiovascular: Negative.   Gastrointestinal: Negative.   Genitourinary: Negative.   Musculoskeletal: Negative.   Skin: Negative.   Neurological: Negative.    Endo/Heme/Allergies: Negative.   Psychiatric/Behavioral: Positive for hallucinations and substance abuse (UDS positive for marijuana ). The patient is nervous/anxious.     Blood pressure 119/80, pulse 101, temperature 97.8 F (36.6 C), temperature source Oral, resp. rate 16, height 5\' 6"  (1.676 m), weight 66.367 kg (146 lb 5 oz).Body mass index is 23.63 kg/(m^2).  General Appearance: Casual  Eye Contact::  Good  Speech:  Pressured  Volume:  Normal  Mood:  Angry  Affect:  Labile  Thought Process:  Circumstantial and Disorganized  Orientation:  Full (Time, Place, and Person)  Thought Content:  Delusions, Hallucinations: Auditory and Paranoid Ideation  Suicidal Thoughts:  No  Homicidal Thoughts:  No  Memory:  Immediate;   Fair Recent;   Fair Remote;   Fair  Judgement:  Impaired  Insight:  Lacking  Psychomotor Activity:  Normal  Concentration:  Fair  Recall:  [employer]  Fund of Knowledge:Fair  Language: Good  Akathisia:  No  Handed:  Right  AIMS (if indicated):     Assets:  Communication Skills Desire for Improvement Physical Health Resilience  Sleep:  Number of Hours: 6   Musculoskeletal: Strength & Muscle Tone: within normal limits Gait & Station: normal Patient leans: N/A  Current Medications: Current Facility-Administered Medications  Medication Dose Route Frequency Provider Last Rate Last Dose  . acetaminophen (TYLENOL) tablet 650 mg  650 mg Oral Q6H PRN Christopher HoughSpencer E Simon, PA-C   650 mg at 01/08/14 0817  . alum & mag hydroxide-simeth (MAALOX/MYLANTA) 200-200-20 MG/5ML suspension 30 mL  30 mL Oral Q4H PRN Christopher Hough, PA-C      . benztropine (COGENTIN) tablet 1 mg  1 mg Oral Daily Christopher Kaufmann, NP   1 mg at 01/08/14 0815  . hydrALAZINE (APRESOLINE) tablet 10 mg  10 mg Oral 4 times per day Christopher Hough, PA-C   10 mg at 01/08/14 0815  . HYDROcodone-acetaminophen (NORCO/VICODIN) 5-325 MG per tablet 1.5 tablet  1.5 tablet Oral Q6H PRN Christopher Grant   1.5 tablet at  01/08/14 0514  . ipratropium-albuterol (DUONEB) 0.5-2.5 (3) MG/3ML nebulizer solution 3 mL  3 mL Nebulization Q6H PRN Christopher Hough, PA-C      . lisinopril (PRINIVIL,ZESTRIL) tablet 10 mg  10 mg Oral Daily Christopher Hough, PA-C   10 mg at 01/08/14 0815  . magnesium hydroxide (MILK OF MAGNESIA) suspension 30 mL  30 mL Oral Daily PRN Christopher Hough, PA-C      . multivitamin with minerals tablet 1 tablet  1 tablet Oral Daily Christopher Hough, PA-C   1 tablet at 01/08/14 0815  . nicotine (NICODERM CQ - dosed in mg/24 hours) patch 21 mg  21 mg Transdermal Daily Christopher Grant   21 mg at 01/08/14 0825  . paliperidone (INVEGA) 24 hr tablet 9 mg  9 mg Oral Daily Christopher Grant   9 mg at 01/08/14 0815  . pantoprazole (PROTONIX) EC tablet 40 mg  40 mg Oral Daily Christopher Hough, PA-C   40 mg at 01/08/14 9604  . thiamine (VITAMIN B-1) tablet 100 mg  100 mg Oral Daily Christopher Hough, PA-C   100 mg at 01/08/14 0815  . traZODone (DESYREL) tablet 150 mg  150 mg Oral QHS Christopher Grant   150 mg at 01/07/14 2156    Lab Results:  No results found for this or any previous visit (from the past 48 hour(s)).  Physical Findings: AIMS: Facial and Oral Movements Muscles of Facial Expression: None, normal Lips and Perioral Area: None, normal Jaw: None, normal Tongue: None, normal,Extremity Movements Upper (arms, wrists, hands, fingers): None, normal Lower (legs, knees, ankles, toes): None, normal, Trunk Movements Neck, shoulders, hips: None, normal, Overall Severity Severity of abnormal movements (highest score from questions above): None, normal Incapacitation due to abnormal movements: None, normal Patient's awareness of abnormal movements (rate only patient's report): No Awareness, Dental Status Current problems with teeth and/or dentures?: No Does patient usually wear dentures?: No  CIWA:  CIWA-Ar Total: 0 COWS:     Treatment Plan Summary: Daily contact with patient to assess and evaluate symptoms  and progress in treatment Medication management  Plan:  1. Continue crisis management and stabilization.  2. Medication management:   -Continue Invega to 9mg  daily for delusions/psychotic symptoms.  -Continue Cogentin to 1 mg daily for EPS prevention.  -Continue Trazodone to 150mg  po hs for insomnia. -Continue Norco to 7.5/325mg  q6 prn for chronic pain. 3. Encouraged patient to attend groups and participate in group counseling sessions and activities.  4. Discharge plan in progress.  5. Continue current treatment plan.  6. Address health issues: Continue Apresoline 10 every six hours for Hypertension. Vitals reviewed and WNL. Hemoglobin A1c WNL at 5.2.   Medical Decision Making Problem Points:  Established problem, stable/improving (1), Review of last therapy session (1) and Review of psycho-social stressors (1) Data Points:  Order Aims Assessment (2) Review or order clinical lab tests (1) Review of medication regiment & side effects (2)  I certify that inpatient services furnished can reasonably  be expected to improve the patient's condition.   Christopher Kaufmann NP-C 11:29 AM 01/08/2014  Patient seen, evaluated and I agree with notes by Nurse Practitioner. Thedore Mins, MD

## 2014-01-08 NOTE — Progress Notes (Signed)
D: Patient denies SI/HI and visual hallucinations; patient reports auditory hallucinations and states " they talking jibberish"; patient reports he requested medication for sleep and that he slept on and off all night; reports appetite is improving ; reports ability to pay attention is improving; rates depression as 7/10; rates hopelessness 7/10; patient reporting back pain  A: Monitored q 15 minutes; patient encouraged to attend groups; patient educated about medications; patient given medications per physician orders; patient encouraged to express feelings and/or concerns  R: Patient is cooperative and appropriate to circumstances ; patient's interaction with staff and peers is appropriate but minimal; patient is taking medications as prescribed and tolerating medications; patient is attending all groups

## 2014-01-08 NOTE — [facility] Suicide Risk Assessment (Addendum)
[facility] INPATIENT:  Family/Significant Other Suicide Prevention Education  Suicide Prevention Education:  Education Completed; No one has been identified by the patient as the family member/significant other with whom the patient will be residing, and identified as the person(s) who will aid the patient in the event of a mental health crisis (suicidal ideations/suicide attempt).  With written consent from the patient, the family member/significant other has been provided the following suicide prevention education, prior to the and/or following the discharge of the patient.  The suicide prevention education provided includes the following:  Suicide risk factors  Suicide prevention and interventions  National Suicide Hotline telephone number  [facility] assessment telephone number  [place] Emergency Assistance 911  County and/or Residential Mobile Crisis Unit telephone number  Request made of family/significant other to:  Remove weapons (e.g., guns, rifles, knives), all items previously/currently identified as safety concern.    Remove drugs/medications (over-the-counter, prescriptions, illicit drugs), all items previously/currently identified as a safety concern.  The family member/significant other verbalizes understanding of the suicide prevention education information provided.  The family member/significant other agrees to remove the items of safety concern listed above. The patient did not endorse SI at the time of admission, nor did the patient c/o SI during the stay here.  SPE not required.   Christopher Grant B 01/08/2014, 4:08 PM 

## 2014-01-08 NOTE — Progress Notes (Signed)
D  Pt appears depressed and sad  He did not attend karaoke group this evening   He interacts minimally with others  he endorses chronic pain and said his pain level does not get much better than 6 even with the pain medication   He denies suicidal ideation and denies any auditory or visual hallucinations  Pt continues to say he thinks his family has been stealing from him A   Verbal support given   Medications administered and effectiveness monitored   Q 15 min checks   Encourage groups and socilaization R   Pt safe at present

## 2014-01-08 NOTE — [facility] Counselor (Signed)
[facility] LCSW Group Therapy  01/08/2014  1:05 PM  Type of Therapy:  Group therapy  Participation Level:  Active  Participation Quality:  Attentive  Affect:  Flat  Cognitive:  Oriented  Insight:  Limited  Engagement in Therapy:  Limited  Modes of Intervention:  Discussion, Socialization  Summary of Progress/Problems:  Chaplain was here to lead a group on themes of hope and courage.  I care for myself.  No one can do a better job of that than me.  If I don't care for myself first, how can I help you?  I'm not being arrogant and boastful, that's just what I do."  Went in and out of group several times, and ultimately did not return.  Daryel Geraldorth, Lynna Zamorano B 01/08/2014 1:37 PM

## 2014-01-09 NOTE — Progress Notes (Signed)
Patient ID: Christopher Grant, male   DOB: [birth year], 58 y.o.   MRN: [id number]    [facility] MD Progress Note  01/09/2014 1:28 PM Christopher Grant  MRN:  [id number] Subjective:  Patient states "I'm trying to be more positive. My appetite is improving. I hear some voices off and on. Sometimes they all want to speak at one time. I think it will take some time for the medicine to really work."  Objective:  Patient is seen and his chart is reviewed. He is reporting a decrease in auditory hallucinations since his Hinda Glatter was increased. The patient is noted to talk less about his family. He will talk openly about his paranoid delusions if prompted. Patient reports a decrease in his psychotic symptoms. He is compliant with his medications and denies adverse effects. The patient is active on the unit and he attends groups. He appears to functioning better at this time. His hygiene has improved.   Diagnosis:   DSM5: Total Time spent with patient: 20 minutes   AXIS I: Paranoid schizophrenia, chronic condition with acute exacerbation  Alcohol abuse  Cannabis abuse  AXIS II: Deferred  AXIS III:  Past Medical History   Diagnosis  Date   .  Chronic Pain   .  Hepatitis     AXIS IV: economic problems, educational problems, housing problems, occupational problems, other psychosocial or environmental problems and problems related to social environment  AXIS V: 41-50 Serious Symptoms   ADL's:  Intact  Sleep: Good  Appetite:  Good  Suicidal Ideation:  Denies Homicidal Ideation:  Denies AEB (as evidenced by):  Psychiatric Specialty Exam: Physical Exam  Review of Systems  Constitutional: Negative.   HENT: Negative.   Eyes: Negative.   Respiratory: Negative.   Cardiovascular: Negative.   Gastrointestinal: Negative.   Genitourinary: Negative.   Musculoskeletal: Negative.   Skin: Negative.   Neurological: Negative.   Endo/Heme/Allergies: Negative.   Psychiatric/Behavioral: Positive for hallucinations  and substance abuse (UDS positive for marijuana ). The patient is nervous/anxious.     Blood pressure 134/76, pulse 74, temperature 98.4 F (36.9 C), temperature source Oral, resp. rate 18, height 5\' 6"  (1.676 m), weight 66.367 kg (146 lb 5 oz).Body mass index is 23.63 kg/(m^2).  General Appearance: Casual  Eye Contact::  Good  Speech:  Pressured  Volume:  Normal  Mood:  Euthymic  Affect:  Appropriate  Thought Process:  Circumstantial  Orientation:  Full (Time, Place, and Person)  Thought Content:  Delusions, Hallucinations: Auditory and Paranoid Ideation  Suicidal Thoughts:  No  Homicidal Thoughts:  No  Memory:  Immediate;   Fair Recent;   Fair Remote;   Fair  Judgement:  Impaired  Insight:  Lacking  Psychomotor Activity:  Normal  Concentration:  Fair  Recall:  [employer] of Knowledge:Fair  Language: Good  Akathisia:  No  Handed:  Right  AIMS (if indicated):     Assets:  Communication Skills Desire for Improvement Physical Health Resilience  Sleep:  Number of Hours: 5.25   Musculoskeletal: Strength & Muscle Tone: within normal limits Gait & Station: normal Patient leans: N/A  Current Medications: Current Facility-Administered Medications  Medication Dose Route Frequency Provider Last Rate Last Dose  . acetaminophen (TYLENOL) tablet 650 mg  650 mg Oral Q6H PRN Kerry Hough, PA-C   650 mg at 01/08/14 0817  . alum & mag hydroxide-simeth (MAALOX/MYLANTA) 200-200-20 MG/5ML suspension 30 mL  30 mL Oral Q4H PRN Kerry Hough, PA-C      .  benztropine (COGENTIN) tablet 1 mg  1 mg Oral Daily Fransisca KaufmannLaura Hayato Guaman, NP   1 mg at 01/09/14 0815  . hydrALAZINE (APRESOLINE) tablet 10 mg  10 mg Oral 4 times per day Kerry HoughSpencer E Simon, PA-C   10 mg at 01/09/14 1203  . HYDROcodone-acetaminophen (NORCO/VICODIN) 5-325 MG per tablet 1.5 tablet  1.5 tablet Oral Q6H PRN Mojeed Akintayo   1.5 tablet at 01/09/14 0619  . ipratropium-albuterol (DUONEB) 0.5-2.5 (3) MG/3ML nebulizer solution 3 mL  3 mL  Nebulization Q6H PRN Kerry HoughSpencer E Simon, PA-C      . lisinopril (PRINIVIL,ZESTRIL) tablet 10 mg  10 mg Oral Daily Kerry HoughSpencer E Simon, PA-C   10 mg at 01/09/14 0816  . magnesium hydroxide (MILK OF MAGNESIA) suspension 30 mL  30 mL Oral Daily PRN Kerry HoughSpencer E Simon, PA-C      . multivitamin with minerals tablet 1 tablet  1 tablet Oral Daily Kerry HoughSpencer E Simon, PA-C   1 tablet at 01/09/14 0816  . nicotine (NICODERM CQ - dosed in mg/24 hours) patch 21 mg  21 mg Transdermal Daily Mojeed Akintayo   21 mg at 01/08/14 0825  . paliperidone (INVEGA) 24 hr tablet 9 mg  9 mg Oral Daily Mojeed Akintayo   9 mg at 01/09/14 0816  . pantoprazole (PROTONIX) EC tablet 40 mg  40 mg Oral Daily Kerry HoughSpencer E Simon, PA-C   40 mg at 01/09/14 0815  . thiamine (VITAMIN B-1) tablet 100 mg  100 mg Oral Daily Kerry HoughSpencer E Simon, PA-C   100 mg at 01/09/14 [id number]  . traZODone (DESYREL) tablet 150 mg  150 mg Oral QHS Mojeed Akintayo   150 mg at 01/08/14 2116    Lab Results:  No results found for this or any previous visit (from the past 48 hour(s)).  Physical Findings: AIMS: Facial and Oral Movements Muscles of Facial Expression: None, normal Lips and Perioral Area: None, normal Jaw: None, normal Tongue: None, normal,Extremity Movements Upper (arms, wrists, hands, fingers): None, normal Lower (legs, knees, ankles, toes): None, normal, Trunk Movements Neck, shoulders, hips: None, normal, Overall Severity Severity of abnormal movements (highest score from questions above): None, normal Incapacitation due to abnormal movements: None, normal Patient's awareness of abnormal movements (rate only patient's report): No Awareness, Dental Status Current problems with teeth and/or dentures?: No Does patient usually wear dentures?: No  CIWA:  CIWA-Ar Total: 0 COWS:     Treatment Plan Summary: Daily contact with patient to assess and evaluate symptoms and progress in treatment Medication management  Plan:  1. Continue crisis management and  stabilization.  2. Medication management:   -Continue Invega to 9mg  daily for delusions/psychotic symptoms.  -Continue Cogentin to 1 mg daily for EPS prevention.  -Continue Trazodone to 150mg  po hs for insomnia. -Continue Norco to 7.5/325mg  q6 prn for chronic pain. 3. Encouraged patient to attend groups and participate in group counseling sessions and activities.  4. Discharge plan in progress.  5. Continue current treatment plan.  6. Address health issues: Continue Apresoline 10 every six hours for Hypertension. Vitals reviewed and WNL.   Medical Decision Making Problem Points:  Established problem, stable/improving (1), Review of last therapy session (1) and Review of psycho-social stressors (1) Data Points:  Order Aims Assessment (2) Review or order clinical lab tests (1) Review of medication regiment & side effects (2)  I certify that inpatient services furnished can reasonably be expected to improve the patient's condition.   Fransisca KaufmannLaura Isaac Dubie NP-C 1:28 PM 01/09/2014

## 2014-01-09 NOTE — Progress Notes (Signed)
Patient ID: Christopher Grant, Christopher Grant   DOB: 12/29/1955, 58 y.o.   MRN: [id number] 01-09-14 nursing shift note: D: pt remains paranoid, delusional and continues to have auditory hallucinations. Pt denies si/hi/av. He is attending groups and coming to the medication window. He has a hx of chronic pain of the abdomen he stated. A: He was given Vicodin at 1357 for abdominal pain. Staff continues to support and encourage. R: he pain was decreased and he went to sleep. On his inventory sheet he wrote: slept well, appetite improving, attention improving with his depression and hopelessness both at 7. W/d symptoms have been agitation and physical problems have been pain, blurred vision and abdominal pain. After discharge he plans to " do the best I can". RN will monitor and Q 15 min's ck's continue.

## 2014-01-09 NOTE — Progress Notes (Signed)
I agree with the assessment and plan.

## 2014-01-09 NOTE — Progress Notes (Signed)
Patient ID: Christopher Grant, male   DOB: 03/09/1956, 58 y.o.   MRN: [id number] Psychoeducational Group Note  Date:  01/09/2014 Time:0910am  Group Topic/Focus:  Identifying Needs:   The focus of this group is to help patients identify their personal needs that have been historically problematic and identify healthy behaviors to address their needs.  Participation Level:  Active  Participation Quality:  Appropriate  Affect:  Not Congruent  Cognitive:  Confused  Insight:  Supportive  Engagement in Group:  Supportive  Additional Comments:  Inventory group   Valente DavidWeaver, Loucinda Croy Brooks 01/09/2014,10:18 AM

## 2014-01-09 NOTE — [facility] Group Notes (Signed)
[facility] Group Notes:  (Clinical Social Work)  01/09/2014  11:00-11:45AM  Summary of Progress/Problems:   The main focus of today's process group was for the patient to identify ways in which they have in the past sabotaged their own recovery and reasons they may have done this/what they received from doing it.  We then worked to identify a specific plan to avoid doing this when discharged from the hospital for this admission.  The patient expressed that he needs to pay attention to his nutrition, and follow a balance diet as well as stay on his medications to stay out of the hospital.  He stated that when he gets off his meds it "makes it worse, so I will keep taking them until I feel I don't need, and then can stop."  Type of Therapy:  Group Therapy - Process  Participation Level:  Active  Participation Quality:  Attentive  Affect:  Blunted  Cognitive:  Appropriate  Insight:  Improving  Engagement in Therapy:  Engaged  Modes of Intervention:  Clarification, Education, Exploration, Discussion  Ambrose MantleMareida Grossman-Orr, LCSW 01/09/2014, 4:33 PM

## 2014-01-09 NOTE — Progress Notes (Signed)
D: Pt denies SI/HI/AV. Pt is pleasant and cooperative.  A: Pt was offered support and encouragement. Pt was given scheduled medications. Pt was encourage to attend groups. Q 15 minute checks were done for safety.  R:Pt attends groups and interacts well with peers and staff. Pt is taking medication. Pt has no complaints.Pt receptive to treatment and safety maintained on unit. 

## 2014-01-09 NOTE — Progress Notes (Signed)
Patient ID: Christopher Grant, male   DOB: 04/12/1956, 58 y.o.   MRN: [id number] Psychoeducational Group Note  Date:  01/09/2014 Time:0930am  Group Topic/Focus:  Identifying Needs:   The focus of this group is to help patients identify their personal needs that have been historically problematic and identify healthy behaviors to address their needs.  Participation Level:  Active  Participation Quality:  Appropriate  Affect:  Not Congruent  Cognitive:  Confused  Insight:  Supportive  Engagement in Group:  Supportive  Additional Comments:  Healthy coping skills.   Valente DavidWeaver, Lenee Franze Brooks 01/09/2014,10:19 AM

## 2014-01-10 MED ORDER — METHOCARBAMOL 500 MG PO TABS
500.0000 mg | ORAL_TABLET | Freq: Three times a day (TID) | ORAL | Status: DC | PRN
  Administered 2014-01-10 – 2014-01-13 (×5): 500 mg via ORAL
  Filled 2014-01-10 (×6): qty 1

## 2014-01-10 NOTE — Progress Notes (Signed)
D. Pt has been up and has been visible in milieu today, has been attending and participating in various milieu activities. Pt spoke about how he has been feeling better and reports that the voices are not bothering him today. Pt has denied any SI today and has received medications without incident. A.Support and encouragement provided, medication education given. R. Pt verbalized understanding, safety maintained, will continue to monitor.

## 2014-01-10 NOTE — Progress Notes (Signed)
The focus of this group is to help patients review their daily goal of treatment and discuss progress on daily workbooks.  Pt did not attend the evening group session. 

## 2014-01-10 NOTE — Progress Notes (Signed)
Patient ID: Christopher Grant, male DOB: 08/08/1955, 58 y.o. MRN: [id number]  Psychoeducational Group Note  Date: 01/10/2014   Time:0945am   Group Topic/Focus: Healthy Support Systems  Participation Level: Active   Participation Quality: Appropriate   Affect: Appropriate  Cognitive: Confused   Insight: Improving  Engagement in Group: Supportive

## 2014-01-10 NOTE — Progress Notes (Signed)
D Pt. Remains safe on the unit, no complaints of pain or discomfort noted.  A Writer offered support and encouragement, discussed medications with pt.  R Pt. Remains safe on the unit, reports a good day, " better than yesterday", and his goal was to prepare for tomorrow because today was Sunday. Pt. Has been active in group and interacting in the milieu.

## 2014-01-10 NOTE — Progress Notes (Signed)
  Psychoeducational Group Note  Date: 01/10/2014   Time:0910am   Group Topic/Focus: Pt Self Inventory  Participation Level: Active   Participation Quality: Appropriate   Affect: Appropriate   Cognitive: Appropriate   Insight: Minimal  Engagement in Group: Supportive

## 2014-01-10 NOTE — [facility] Group Notes (Signed)
[facility] Group Notes:  (Clinical Social Work)  01/10/2014   11:15am-12:00pm  Summary of Progress/Problems:  The main focus of today's process group was to listen to a variety of genres of music and to identify that different types of music provoke different responses.  The patient then was able to identify personally what was soothing for them, as well as energizing.  Handouts were used to record feelings evoked, as well as how patient can personally use this knowledge in sleep habits, with depression, and with other symptoms.  The patient expressed understanding of concepts, as well as knowledge of how each type of music affected him/her and how this can be used at home as a wellness/recovery tool.  He was responding to internal stimuli throughout group.  Type of Therapy:  Music Therapy   Participation Level:  Active  Participation Quality:  Attentive and Sharing  Affect:  Flat  Cognitive:  Hallucinating  Insight:  Engaged  Engagement in Therapy:  Engaged  Modes of Intervention:   Activity, Exploration  Ambrose MantleMareida Grossman-Orr, LCSW 01/10/2014, 12:30pm

## 2014-01-10 NOTE — Progress Notes (Signed)
I agree with the assessment and plan.
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
## 2014-01-10 NOTE — Progress Notes (Signed)
Adult Psychoeducational Group Note  Date:  01/10/2014 Time:  9:02 PM  Group Topic/Focus:  Wrap-Up Group:   The focus of this group is to help patients review their daily goal of treatment and discuss progress on daily workbooks.  Participation Level:  Active  Participation Quality:  Appropriate  Affect:  Appropriate  Cognitive:  Alert  Insight: Good  Engagement in Group:  Engaged  Modes of Intervention:  Discussion  Additional Comments:  Pt rated his day 7/10 and explained that today was better than yesterday. His goal was to prepare for tomorrow. He was alert and appropriate during group.  [place]ford Shihomas, Ashonti Leandro K 01/10/2014, 9:02 PM
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
## 2014-01-11 NOTE — Progress Notes (Signed)
Adult Psychoeducational Group Note  Date:  01/11/2014 Time:  9:28 PM  Group Topic/Focus:  Wrap-Up Group:   The focus of this group is to help patients review their daily goal of treatment and discuss progress on daily workbooks.  Participation Level:  Minimal  Participation Quality:  Appropriate  Affect:  Flat  Cognitive:  Lacking  Insight: Limited  Engagement in Group:  Limited  Modes of Intervention:  Socialization and Support  Additional Comments:  Patient attended and participated in group tonight. He reports that today he took it easy and just look for the good in all things. He did attended groups and went for meals.  Lita MainsFrancis, Billyjoe Go [facility] 01/11/2014, 9:28 PM

## 2014-01-11 NOTE — [facility] Group Notes (Signed)
[facility]   01/11/2014 11:01 AM  Participation Quality:  Engaged  Mood/Affect:  Flat  Depression Rating:  5  Anxiety Rating:  5  Thoughts of Suicide:  No Will you contract for safety?   NA  Current AVH:  No  Plan for Discharge/Comments:  "The food is good here.  I'm still figuring things out.  I need a blackboard or something so I can get things down where I can see them."  Pt still vague with no specific plan.  Let him know he would be discharged on Wednesday, and our [facility] before taking the next step.  He'll think it over.  Transportation Means: [place]  Supports: family  [place], [place]

## 2014-01-11 NOTE — Progress Notes (Signed)
Patient ID: Christopher Grant, male   DOB: 10-Sep-1955, 58 y.o.   MRN: [id number]    [facility] MD Progress Note  01/11/2014 10:15 AM Christopher Grant  MRN:  [id number] Subjective:  Patient reports:  "I am still hearing voices but not as bordersome as last week.''The voices are starting to get better. I am not worrying about my family since I disowned them. I still need help getting a Clinical research associate. I am just trying to take care of me right now. I am eating more vegetables."   Objective:  Patient is seen and his chart is reviewed. He is endorsing decrease delusional thinking and auditory hallucinations. Says he has been hearing voices only intermittently. However, his fixed delusions about his family stealing money from him is waning. Patient is also endorsing decreased mood swings or irritability. He is compliant with his medications and has not endorsed any adverse effects. Patient has been attending the unit milieu with little or no re-directions.  Diagnosis:   DSM5: Total Time spent with patient: 20 minutes   AXIS I: Paranoid schizophrenia, chronic condition with acute exacerbation  Alcohol abuse  Cannabis abuse  AXIS II: Deferred  AXIS III:  Past Medical History   Diagnosis  Date   .  Chronic Pain   .  Hepatitis     AXIS IV: economic problems, educational problems, housing problems, occupational problems, other psychosocial or environmental problems and problems related to social environment  AXIS V: 50-60 moderate Symptoms   ADL's:  Intact  Sleep: Good  Appetite:  Good  Suicidal Ideation:  Denies Homicidal Ideation:  Denies AEB (as evidenced by):  Psychiatric Specialty Exam: Physical Exam  Review of Systems  Constitutional: Negative.   HENT: Negative.   Eyes: Negative.   Respiratory: Negative.   Cardiovascular: Negative.   Gastrointestinal: Negative.   Genitourinary: Negative.   Musculoskeletal: Negative.   Skin: Negative.   Neurological: Negative.   Endo/Heme/Allergies: Negative.    Psychiatric/Behavioral: Positive for hallucinations and substance abuse (UDS positive for marijuana ). The patient is nervous/anxious.     Blood pressure 115/85, pulse 108, temperature 99 F (37.2 C), temperature source Oral, resp. rate 18, height 5\' 6"  (1.676 m), weight 66.367 kg (146 lb 5 oz).Body mass index is 23.63 kg/(m^2).  General Appearance: Casual  Eye Contact::  Good  Speech:  Clear and Coherent  Volume:  Normal  Mood:  Euthymic  Affect:  Appropriate  Thought Process:  Circumstantial  Orientation:  Full (Time, Place, and Person)  Thought Content:  Delusions, Hallucinations  Suicidal Thoughts:  No  Homicidal Thoughts:  No  Memory:  Immediate;   Fair Recent;   Fair Remote;   Fair  Judgement:  Impaired  Insight:  Lacking  Psychomotor Activity:  Normal  Concentration:  Fair  Recall:  [employer] of Knowledge:Fair  Language: Good  Akathisia:  No  Handed:  Right  AIMS (if indicated):     Assets:  Communication Skills Desire for Improvement Physical Health Resilience  Sleep:  Number of Hours: 6.5   Musculoskeletal: Strength & Muscle Tone: within normal limits Gait & Station: normal Patient leans: N/A  Current Medications: Current Facility-Administered Medications  Medication Dose Route Frequency Provider Last Rate Last Dose  . acetaminophen (TYLENOL) tablet 650 mg  650 mg Oral Q6H PRN Kerry Hough, PA-C   650 mg at 01/08/14 0817  . alum & mag hydroxide-simeth (MAALOX/MYLANTA) 200-200-20 MG/5ML suspension 30 mL  30 mL Oral Q4H PRN Kerry Hough, PA-C      .  benztropine (COGENTIN) tablet 1 mg  1 mg Oral Daily Fransisca KaufmannLaura Davis, NP   1 mg at 01/11/14 0756  . hydrALAZINE (APRESOLINE) tablet 10 mg  10 mg Oral 4 times per day Kerry HoughSpencer E Simon, PA-C   10 mg at 01/10/14 1811  . HYDROcodone-acetaminophen (NORCO/VICODIN) 5-325 MG per tablet 1.5 tablet  1.5 tablet Oral Q6H PRN Daneesha Quinteros   1.5 tablet at 01/11/14 0640  . ipratropium-albuterol (DUONEB) 0.5-2.5 (3) MG/3ML  nebulizer solution 3 mL  3 mL Nebulization Q6H PRN Kerry HoughSpencer E Simon, PA-C      . lisinopril (PRINIVIL,ZESTRIL) tablet 10 mg  10 mg Oral Daily Kerry HoughSpencer E Simon, PA-C   10 mg at 01/11/14 0757  . magnesium hydroxide (MILK OF MAGNESIA) suspension 30 mL  30 mL Oral Daily PRN Kerry HoughSpencer E Simon, PA-C      . methocarbamol (ROBAXIN) tablet 500 mg  500 mg Oral Q8H PRN Fransisca KaufmannLaura Davis, NP   500 mg at 01/11/14 0759  . multivitamin with minerals tablet 1 tablet  1 tablet Oral Daily Kerry HoughSpencer E Simon, PA-C   1 tablet at 01/11/14 0757  . nicotine (NICODERM CQ - dosed in mg/24 hours) patch 21 mg  21 mg Transdermal Daily Glenna Brunkow   21 mg at 01/11/14 0756  . paliperidone (INVEGA) 24 hr tablet 9 mg  9 mg Oral Daily Anival Pasha   9 mg at 01/11/14 0756  . pantoprazole (PROTONIX) EC tablet 40 mg  40 mg Oral Daily Kerry HoughSpencer E Simon, PA-C   40 mg at 01/11/14 0757  . thiamine (VITAMIN B-1) tablet 100 mg  100 mg Oral Daily Kerry HoughSpencer E Simon, PA-C   100 mg at 01/11/14 0756  . traZODone (DESYREL) tablet 150 mg  150 mg Oral QHS Knight Oelkers   150 mg at 01/09/14 2132    Lab Results:  No results found for this or any previous visit (from the past 48 hour(s)).  Physical Findings: AIMS: Facial and Oral Movements Muscles of Facial Expression: None, normal Lips and Perioral Area: None, normal Jaw: None, normal Tongue: None, normal,Extremity Movements Upper (arms, wrists, hands, fingers): None, normal Lower (legs, knees, ankles, toes): None, normal, Trunk Movements Neck, shoulders, hips: None, normal, Overall Severity Severity of abnormal movements (highest score from questions above): None, normal Incapacitation due to abnormal movements: None, normal Patient's awareness of abnormal movements (rate only patient's report): No Awareness, Dental Status Current problems with teeth and/or dentures?: No Does patient usually wear dentures?: No  CIWA:  CIWA-Ar Total: 2 COWS:     Treatment Plan Summary: Daily contact with  patient to assess and evaluate symptoms and progress in treatment Medication management  Plan:  1. Continue crisis management and stabilization.  2. Medication management:   -Continue Invega to 9mg  daily for delusions/psychotic symptoms.  -Continue Cogentin to 1 mg daily for EPS prevention.  -Continue Trazodone to 150mg  po hs for insomnia. -Continue Norco to 7.5/325mg  q6 prn for chronic pain. -Continue Robaxin 500 mg every eight hours prn skeletal pain.  3. Encouraged patient to attend groups and participate in group counseling sessions and activities.  4. Discharge plan in progress.  5. Continue current treatment plan.  6. Address health issues: Continue Apresoline 10 every six hours for Hypertension. Vitals reviewed and WNL.   Medical Decision Making Problem Points:  Established problem, stable/improving (1), Review of last therapy session (1) and Review of psycho-social stressors (1) Data Points:  Order Aims Assessment (2) Review or order clinical lab tests (1) Review of medication  regiment & side effects (2)  I certify that inpatient services furnished can reasonably be expected to improve the patient's condition.   Thedore Mins, MD 10:15 AM 01/11/2014

## 2014-01-11 NOTE — Progress Notes (Signed)
D. Pt has been up and has been active in milieu this evening, attending and participating in various activities. Pt has endorsed auditory hallucinations but spoke about how they have been improving. Pt spoke some about discharge on Wednesday but was hesitant to commit to want to go with his sister and spoke about how he would like an alternative plan to weigh his options but spoke about how he does not have an alternative plan and spoke about how he would address that in the morning. Pt has received all medications without incident and has denied any SI. A. Support and encouragement provided. R. Safety maintained, will continue to monitor.

## 2014-01-11 NOTE — Progress Notes (Signed)
Patient out of bed. In the shower.  Q 15 safety checks continue.

## 2014-01-11 NOTE — Progress Notes (Signed)
Pt presents with flat affect and depressed mood. Pt rates depression 7/10 and hopeless 6/10. Pt reported having AH today. Pt denies SI/HI/VH. Pt c/o generalized pain and given prn pain med at his request. Pt compliant with taking meds and attending groups. Per pt, he is still trying to figure out his discharge plans.  Medications administered as ordered per MD. Verbal support given. Pt encouraged to attend groups. 15 minute checks performed for safety. Pt safety maintained at this time.

## 2014-01-11 NOTE — [facility] Group Notes (Signed)
[facility] LCSW Group Therapy  01/11/2014 1:15 pm  Type of Therapy: Process Group Therapy  Participation Level:  Active  Participation Quality:  Appropriate  Affect:  Flat  Cognitive:  Oriented  Insight:  Improving  Engagement in Group:  Limited  Engagement in Therapy:  Limited  Modes of Intervention:  Activity, Clarification, Education, Problem-solving and Support  Summary of Progress/Problems: Today's group addressed the issue of overcoming obstacles.  Patients were asked to identify their biggest obstacle post d/c that stands in the way of their on-going success, and then problem solve as to how to manage this.  "I need to stay focused."  When asked for clarification on this, he restated the same statement.  Same vague comments he has been making since admission.  Daryel Geraldorth, Renu Asby B 01/11/2014   4:01 PM

## 2014-01-11 NOTE — Progress Notes (Signed)
Patient denies SI, HI, AVH at present. States that he "hurts all over". Reports pain 9.5/10.   Encouragement offered. Holding medications until vitals are taken.  Q 15 safety checks continue.
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
## 2014-01-12 NOTE — Progress Notes (Signed)
Pt has been sitting in the corner of the dayroom all evening watching TV and occasionally talking with his peers.  Pt seems quiet/sullen this evening.  He is going to be discharged tomorrow to go home.  He is aware of his discharge plans, but he is still suspicious of his family stealing from him.  He is still actively hearing voices and staff has heard him talking to himself as if to someone unseen.  He is pleasant/cooperative with staff.  He is med compliant on the unit and reports they are helping.  Pt denies SI/HI.  Pt make his needs known to staff.  Support and encouragement offered. Safety maintained with q15 minute checks.

## 2014-01-12 NOTE — Progress Notes (Signed)
Patient ID: Christopher Grant, male   DOB: 06/10/1956, 58 y.o.   MRN: [id number]    [facility]  01/12/2014 11:37 AM Christopher Grant  MRN:  [id number] Subjective:  Patient reports:  "I am getting better, the voices are less.''   Objective:  Patient is seen and his chart is reviewed. Patient reports no new symptoms, he continues to endorse decrease mood swings, psychosis and delusional thinking . However, he continue to have a fixed delusions about his family stealing money from as a result he does not want to live with them anymore.  He is compliant with his medications and has not endorsed any adverse effects. Patient has been attending the unit milieu. Diagnosis:   DSM5: Total Time spent with patient: 20 minutes   AXIS I: Paranoid schizophrenia, chronic condition with acute exacerbation  Alcohol abuse  Cannabis abuse  AXIS II: Deferred  AXIS III:  Past Medical History   Diagnosis  Date   .  Chronic Pain   .  Hepatitis     AXIS IV: economic problems, educational problems, housing problems, occupational problems, other psychosocial or environmental problems and problems related to social environment  AXIS V: 50-60 moderate Symptoms   ADL's:  Intact  Sleep: Good  Appetite:  Good  Suicidal Ideation:  Denies Homicidal Ideation:  Denies AEB (as evidenced by):  Psychiatric Specialty Exam: Physical Exam  Psychiatric: He has a normal mood and affect. His speech is normal and behavior is normal. Thought content is paranoid. Cognition and memory are normal. He expresses impulsivity.    Review of Systems  Constitutional: Negative.   HENT: Negative.   Eyes: Negative.   Respiratory: Negative.   Cardiovascular: Negative.   Gastrointestinal: Negative.   Genitourinary: Negative.   Musculoskeletal: Negative.   Skin: Negative.   Neurological: Negative.   Endo/Heme/Allergies: Negative.   Psychiatric/Behavioral: Negative.  Substance abuse: UDS positive for marijuana     Blood  pressure 130/82, pulse 91, temperature 98.6 F (37 C), temperature source Oral, resp. rate 20, height 5\' 6"  (1.676 m), weight 66.367 kg (146 lb 5 oz).Body mass index is 23.63 kg/(m^2).  General Appearance: Casual  Eye Contact::  Good  Speech:  Clear and Coherent  Volume:  Normal  Mood:  Euthymic  Affect:  Appropriate  Thought Process:  Circumstantial  Orientation:  Full (Time, Place, and Person)  Thought Content:  Delusions, Hallucinations  Suicidal Thoughts:  No  Homicidal Thoughts:  No  Memory:  Immediate;   Fair Recent;   Fair Remote;   Fair  Judgement:  Impaired  Insight:  Lacking  Psychomotor Activity:  Normal  Concentration:  Fair  Recall:  [employer]  Fund of Knowledge:Fair  Language: Good  Akathisia:  No  Handed:  Right  AIMS (if indicated):     Assets:  Communication Skills Desire for Improvement Physical Health Resilience  Sleep:  Number of Hours: 3   Musculoskeletal: Strength & Muscle Tone: within normal limits Gait & Station: normal Patient leans: N/A  Current Medications: Current Facility-Administered Medications  Medication Dose Route Frequency Provider Last Rate Last Dose  . acetaminophen (TYLENOL) tablet 650 mg  650 mg Oral Q6H PRN Kerry HoughSpencer E Simon, PA-C   650 mg at 01/08/14 0817  . alum & mag hydroxide-simeth (MAALOX/MYLANTA) 200-200-20 MG/5ML suspension 30 mL  30 mL Oral Q4H PRN Kerry HoughSpencer E Simon, PA-C      . benztropine (COGENTIN) tablet 1 mg  1 mg Oral Daily Fransisca KaufmannLaura Davis, NP   1 mg at  01/12/14 0757  . hydrALAZINE (APRESOLINE) tablet 10 mg  10 mg Oral 4 times per day Kerry Hough, PA-C   10 mg at 01/12/14 0606  . HYDROcodone-acetaminophen (NORCO/VICODIN) 5-325 MG per tablet 1.5 tablet  1.5 tablet Oral Q6H PRN Selinda Korzeniewski   1.5 tablet at 01/12/14 0334  . ipratropium-albuterol (DUONEB) 0.5-2.5 (3) MG/3ML nebulizer solution 3 mL  3 mL Nebulization Q6H PRN Kerry Hough, PA-C      . lisinopril (PRINIVIL,ZESTRIL) tablet 10 mg  10 mg Oral Daily Kerry Hough, PA-C   10 mg at 01/12/14 0757  . magnesium hydroxide (MILK OF MAGNESIA) suspension 30 mL  30 mL Oral Daily PRN Kerry Hough, PA-C      . methocarbamol (ROBAXIN) tablet 500 mg  500 mg Oral Q8H PRN Fransisca Kaufmann, NP   500 mg at 01/12/14 0334  . multivitamin with minerals tablet 1 tablet  1 tablet Oral Daily Kerry Hough, PA-C   1 tablet at 01/12/14 0757  . nicotine (NICODERM CQ - dosed in mg/24 hours) patch 21 mg  21 mg Transdermal Daily Loghan Subia   21 mg at 01/12/14 0801  . paliperidone (INVEGA) 24 hr tablet 9 mg  9 mg Oral Daily Mykai Wendorf   9 mg at 01/12/14 0756  . pantoprazole (PROTONIX) EC tablet 40 mg  40 mg Oral Daily Kerry Hough, PA-C   40 mg at 01/12/14 0757  . thiamine (VITAMIN B-1) tablet 100 mg  100 mg Oral Daily Kerry Hough, PA-C   100 mg at 01/12/14 0757  . traZODone (DESYREL) tablet 150 mg  150 mg Oral QHS Goldie Dimmer   150 mg at 01/11/14 2224    Lab Results:  No results found for this or any previous visit (from the past 48 hour(s)).  Physical Findings: AIMS: Facial and Oral Movements Muscles of Facial Expression: None, normal Lips and Perioral Area: None, normal Jaw: None, normal Tongue: None, normal,Extremity Movements Upper (arms, wrists, hands, fingers): None, normal Lower (legs, knees, ankles, toes): None, normal, Trunk Movements Neck, shoulders, hips: None, normal, Overall Severity Severity of abnormal movements (highest score from questions above): None, normal Incapacitation due to abnormal movements: None, normal Patient's awareness of abnormal movements (rate only patient's report): No Awareness, Dental Status Current problems with teeth and/or dentures?: No Does patient usually wear dentures?: No  CIWA:  CIWA-Ar Total: 2 COWS:     Treatment Plan Summary: Daily contact with patient to assess and evaluate symptoms and progress in treatment Medication management  Plan:  1. Continue crisis management and stabilization.  2.  Medication management:   -Continue Invega to 9mg  daily for delusions/psychotic symptoms.  -Continue Cogentin to 1 mg daily for EPS prevention.  -Continue Trazodone to 150mg  po hs for insomnia. -Continue Norco to 7.5/325mg  q6 prn for chronic pain. -Continue Robaxin 500 mg every eight hours prn skeletal pain.  3. Encouraged patient to attend groups and participate in group counseling sessions and activities.  4. Discharge plan in progress.  5. Continue current treatment plan.  6. Address health issues: Continue Apresoline 10 every six hours for Hypertension. Vitals reviewed and WNL.   Medical Decision Making Problem Points:  Established problem, stable/improving (1), Review of last therapy session (1) and Review of psycho-social stressors (1) Data Points:  Order Aims Assessment (2) Review or order clinical lab tests (1) Review of medication regiment & side effects (2)  I certify that inpatient services furnished can reasonably be expected to improve  the patient's condition.   Thedore Mins, MD 11:37 AM 01/12/2014

## 2014-01-12 NOTE — Tx Team (Signed)
  Interdisciplinary Treatment Plan Update   Date Reviewed:  01/12/2014  Time Reviewed:  2:27 PM  Progress in Treatment:   Attending groups: Yes Participating in groups: Yes Taking medication as prescribed: Yes  Tolerating medication: Yes Family/Significant other contact made: Yes  Patient understands diagnosis: Yes  Discussing patient identified problems/goals with staff: Yes Medical problems stabilized or resolved: Yes Denies suicidal/homicidal ideation: Yes Patient has not harmed self or others: Yes  For review of initial/current patient goals, please see plan of care.  Estimated Length of Stay:  D/C tomorrow  Reason for Continuation of Hospitalization:   New Problems/Goals identified:  N/A  Discharge Plan or Barriers:   return home, follow up outpt  Additional Comments:  Attendees:  Signature: Thedore MinsMojeed Akintayo, MD 01/12/2014 2:27 PM   Signature: Richelle Itood Karriem Muench, LCSW 01/12/2014 2:27 PM  Signature: Fransisca KaufmannLaura Davis, NP 01/12/2014 2:27 PM  Signature: Joslyn Devonaroline Beaudry, RN 01/12/2014 2:27 PM  Signature: Liborio NixonPatrice White, RN 01/12/2014 2:27 PM  Signature:  01/12/2014 2:27 PM  Signature:   01/12/2014 2:27 PM  Signature:    Signature:    Signature:    Signature:    Signature:    Signature:      Scribe for Treatment Team:   Richelle Itood Jersey Espinoza, LCSW  01/12/2014 2:27 PM

## 2014-01-12 NOTE — [facility] Group Notes (Signed)
[facility] LCSW Group Therapy  01/12/2014 , 1:15 PM   Type of Therapy:  Group Therapy  Participation Level:  Active  Participation Quality:  Attentive  Affect:  Appropriate  Cognitive:  Alert  Insight:  Improving  Engagement in Therapy:  Engaged  Modes of Intervention:  Discussion, Exploration and Socialization  Summary of Progress/Problems: Today's group focused on the term Diagnosis.  Participants were asked to define the term, and then pronounce whether it is a negative, positive or neutral term.  Sat quietly throughout.  Offered nothing to the conversation.  Daryel Geraldorth, Jalayne Ganesh B 01/12/2014 , 1:15 PM

## 2014-01-12 NOTE — [facility] Group Notes (Signed)
Adult Psychoeducational Group Note  Date:  01/12/2014 Time:  9:20 PM  Group Topic/Focus:  Wrap-Up Group:   The focus of this group is to help patients review their daily goal of treatment and discuss progress on daily workbooks.  Participation Level:  Minimal  Participation Quality:  Attentive  Affect:  Flat  Cognitive:  Appropriate  Insight: Lacking  Engagement in Group:  Limited  Modes of Intervention:  Discussion  Additional Comments:  Ethelene Brownsnthony stated his day was all right.  He expressed that the day went kind of slow.  He is discharging tomorrow "to an appointment".  Caroll RancherLindsay, Kemet Nijjar A 01/12/2014, 9:20 PM

## 2014-01-12 NOTE — Progress Notes (Signed)
Adult Psychoeducational Group Note  Date:  01/12/2014 Time:  0930 am  Group Topic/Focus:  Wellness Toolbox:   The focus of this group is to discuss various aspects of wellness, balancing those aspects and exploring ways to increase the ability to experience wellness.  Patients will create a wellness toolbox for use upon discharge.  Participation Level:  Active  Participation Quality:  Appropriate  Affect:  Appropriate  Cognitive:  Appropriate  Insight: Appropriate  Engagement in Group:  Engaged  Modes of Intervention:  Education  Additional Comments:    Jabarie Pop L 01/12/2014, 4:09 PM

## 2014-01-12 NOTE — Progress Notes (Signed)
[facility] :  Will you be returning to the same living situation after discharge: Yes,  home At discharge, do you have transportation home?:Yes,  [place] [facility]; sister from there Do you have the ability to pay for your medications:Yes,  mental health  Release of information consent forms completed and in the chart;  Patient's signature needed at discharge.  Patient to Follow up at: Follow-up Information   Follow up with [facility] On 01/15/2014. (Go to your hospital follow up appointment between 8 and 11 AM on Friday)    Contact information:   [address]  [place]  [phone]      Patient denies SI/HI:   Yes,  yes    Safety Planning and Suicide Prevention discussed:  Yes,  yes  Daryel Geraldorth, Silvanna Ohmer B 01/12/2014, 2:34 PM

## 2014-01-12 NOTE — Progress Notes (Signed)
Pt presents with flat affect and depressed mood. Pt rates depression 7/10 and stated that he feels 50/50 today. Pt has minimal interaction on the unit. Pt irritable today and remains suspicious of his sister. Pt continues to complain of generalized weakness. Pt given prn meds at his request.  Pt compliant with taking meds and attending groups. Medications administered as ordered per MD. Verbal support given. Pt encouraged to attend groups. 15 minute checks performed for safety. Pt safety maintained at this time.

## 2014-01-12 NOTE — Progress Notes (Signed)
Slept for a while and discomfort relieved with prn  muscle relaxant and pain medication. He is asking if he gets medication this morning. No complaints.

## 2014-01-13 MED ORDER — THIAMINE HCL 100 MG PO TABS: 100.0000 mg | ORAL_TABLET | Freq: Every day | ORAL | Status: AC

## 2014-01-13 MED ORDER — ONE-DAILY MULTI VITAMINS PO TABS: 1.0000 | ORAL_TABLET | Freq: Every day | ORAL | Status: DC

## 2014-01-13 MED ORDER — HYDROCODONE-ACETAMINOPHEN 5-325 MG PO TABS
1.0000 | ORAL_TABLET | Freq: Three times a day (TID) | ORAL | Status: DC
  Administered 2014-01-13: 1 via ORAL
  Filled 2014-01-13: qty 1

## 2014-01-13 MED ORDER — PALIPERIDONE ER 9 MG PO TB24: 9.0000 mg | ORAL_TABLET | Freq: Every day | ORAL | Status: AC

## 2014-01-13 MED ORDER — HYDROCODONE-ACETAMINOPHEN 5-325 MG PO TABS: 1.0000 | ORAL_TABLET | Freq: Three times a day (TID) | ORAL | Status: DC

## 2014-01-13 MED ORDER — PALIPERIDONE ER 3 MG PO TB24
9.0000 mg | ORAL_TABLET | Freq: Every day | ORAL | Status: DC
  Filled 2014-01-13: qty 42

## 2014-01-13 MED ORDER — LISINOPRIL 40 MG PO TABS: 40.0000 mg | ORAL_TABLET | Freq: Every day | ORAL | Status: AC

## 2014-01-13 MED ORDER — BENZTROPINE MESYLATE 1 MG PO TABS: 1.0000 mg | ORAL_TABLET | Freq: Every day | ORAL | Status: AC

## 2014-01-13 MED ORDER — TRAZODONE HCL 150 MG PO TABS: 150.0000 mg | ORAL_TABLET | Freq: Every day | ORAL | Status: AC

## 2014-01-13 MED ORDER — HYDRALAZINE HCL 10 MG PO TABS: 10.0000 mg | ORAL_TABLET | Freq: Four times a day (QID) | ORAL | Status: AC

## 2014-01-13 MED ORDER — OMEPRAZOLE 20 MG PO CPDR: 40.0000 mg | DELAYED_RELEASE_CAPSULE | Freq: Every day | ORAL | Status: AC

## 2014-01-13 NOTE — [facility] Suicide Risk Assessment (Signed)
   Demographic Factors:  Male, Low socioeconomic status, Unemployed and homeless  Total Time spent with patient: 20 minutes  Psychiatric Specialty Exam: Physical Exam  Psychiatric: He has a normal mood and affect. His speech is normal and behavior is normal. Judgment and thought content normal. Cognition and memory are normal.    Review of Systems  Constitutional: Negative.   HENT: Negative.   Eyes: Negative.   Respiratory: Negative.   Cardiovascular: Negative.   Gastrointestinal: Negative.   Genitourinary: Negative.   Musculoskeletal: Negative.   Skin: Negative.   Neurological: Negative.   Endo/Heme/Allergies: Negative.   Psychiatric/Behavioral: Negative.     Blood pressure 112/85, pulse 103, temperature 98.3 F (36.8 C), temperature source Oral, resp. rate 18, height 5\' 6"  (1.676 m), weight 66.367 kg (146 lb 5 oz).Body mass index is 23.63 kg/(m^2).  General Appearance: Fairly Groomed  Patent attorneyye Contact::  Good  Speech:  Clear and Coherent and Normal Rate  Volume:  Normal  Mood:  Euthymic  Affect:  Appropriate  Thought Process:  Goal Directed  Orientation:  Full (Time, Place, and Person)  Thought Content:  Negative  Suicidal Thoughts:  No  Homicidal Thoughts:  No  Memory:  Immediate;   Fair Recent;   Fair Remote;   Fair  Judgement:  marginal  Insight:  marginal  Psychomotor Activity:  Normal  Concentration:  Fair  Recall:  F[employer]Fair  Fund of Knowledge:Fair  Language: Fair  Akathisia:  No  Handed:  Right  AIMS (if indicated):     Assets:  Communication Skills Desire for Improvement Physical Health  Sleep:  Number of Hours: 6    Musculoskeletal: Strength & Muscle Tone: within normal limits Gait & Station: normal Patient leans: N/A   Mental Status Per Nursing Assessment::   On Admission:     Current Mental Status by Physician: patient denies suicidal ideation, intent or plan  Loss Factors: Financial problems/change in socioeconomic status  Historical  Factors: NA  Risk Reduction Factors:   Living with another person, especially a relative and Positive social support  Continued Clinical Symptoms:  Resolution delusions/psychosis  Cognitive Features That Contribute To Risk:  Closed-mindedness Polarized thinking    Suicide Risk:  Minimal: No identifiable suicidal ideation.  Patients presenting with no risk factors but with morbid ruminations; may be classified as minimal risk based on the severity of the depressive symptoms  Discharge Diagnoses:   AXIS I:  Paranoid schizophrenia, chronic condition with acute exacerbation              Alcohol use disorder              Cannabis use disorder AXIS II:  Cluster B Traits AXIS III:   Past Medical History  Diagnosis Date  . Chronic pain   . Hepatitis    AXIS IV:  housing problems, other psychosocial or environmental problems and problems related to social environment AXIS V:  61-70 mild symptoms  Plan Of Care/Follow-up recommendations:  Activity:  as tolerated Diet:  healthy Tests:  routine Other:  patient to keep his after care appointment  Is patient on multiple antipsychotic therapies at discharge:  No   Has Patient had three or more failed trials of antipsychotic monotherapy by history:  No  Recommended Plan for Multiple Antipsychotic Therapies: NA    Thedore MinsAkintayo, Briston Lax, MD 01/13/2014, 9:14 AM

## 2014-01-13 NOTE — Progress Notes (Signed)
Patient ID: Christopher Grant, male   DOB: 10/12/1955, 58 y.o.   MRN: [id number] Patient discharged home per MD order.  Patient received all personal belongings, prescriptions and medication samples.  He denies any SI/HI/AVH.  Patient left ambulatory will [employer] without incident.  Patient's [facility] was called [facility]. Patient left without incident.

## 2014-01-13 NOTE — Discharge Summary (Signed)
Physician Discharge Summary Note  Patient:  Christopher Grant is an 58 y.o., male MRN:  [id number] DOB:  [birth year] Patient phone:  [phone] (home)  Patient address:   [address] [place] [address],  Total Time spent with patient: 20 minutes  Date of Admission:  12/29/2013 Date of Discharge: 01/13/14  Reason for Admission:  Psychosis   Discharge Diagnoses: Principal Problem:   Paranoid schizophrenia, chronic condition with acute exacerbation Active Problems:   Alcohol abuse   Cannabis abuse   Essential hypertension, benign  Psychiatric Specialty Exam: Physical Exam  Review of Systems  Constitutional: Negative.   HENT: Negative.   Eyes: Negative.   Respiratory: Negative.   Cardiovascular: Negative.   Gastrointestinal: Negative.   Genitourinary: Negative.   Musculoskeletal: Negative.   Skin: Negative.   Neurological: Negative.   Endo/Heme/Allergies: Negative.   Psychiatric/Behavioral: Negative.     Blood pressure 148/77, pulse 103, temperature 98.3 F (36.8 C), temperature source Oral, resp. rate 18, height 5\' 6"  (1.676 m), weight 66.367 kg (146 lb 5 oz).Body mass index is 23.63 kg/(m^2).  See Physician SRA                                                  Past Psychiatric History: See H&P Diagnosis:  Hospitalizations:  Outpatient Care:  Substance Abuse Care:  Self-Mutilation:  Suicidal Attempts:  Violent Behaviors:   Musculoskeletal: Strength & Muscle Tone: within normal limits Gait & Station: normal Patient leans: N/A  DSM5: AXIS I: Paranoid schizophrenia, chronic condition with acute exacerbation  Alcohol use disorder  Cannabis use disorder  AXIS II: Cluster B Traits  AXIS III:  Past Medical History   Diagnosis  Date   .  Chronic pain    .  Hepatitis    AXIS IV: housing problems, other psychosocial or environmental problems and problems related to social environment  AXIS V: 61-70 mild symptoms   Level of Care:   OP  Hospital Course:  Christopher Grant is a 58 year old male who presented to [facility] after being picked up by police. On admission the patient reported hearing voices and expressed fears about being poisoned. The patient has not been taking any psychiatric medications recently. He has a long history of schizophrenia. The patient states today during his psychiatric assessment "A lady used trickery to get me here. She is not my relative anymore. I need a Clinical research associate. I have not been here long. My sister brought me. I think she wants to steal my inheritance. It's a setup. I was poisoned. I could tell because I got very sick. I feel very irritable about all of this. I stopped my medications from the [place] because they were poisoned. Nobody has the right to invade my space." Christopher Grant was extremely agitated and disorganized during the assessment. The patient was unable to provide an accurate history. His chart was reviewed for necessary information. Patient admits to drinking alcohol and has history of hepatitis. 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 Patient's AST is elevated at 93.          Christopher Grant was admitted to the adult 400 unit where he was evaluated and his symptoms were identified. Medication management was discussed and implemented. The patient was initially prescribed Haldol for psychotic symptoms. This medication was not found to be effectively managing his psychosis.  The patient was switched to [facility], which was titrated up to 9 mg daily for psychotic symptoms.  He was encouraged to participate in unit programming. Medical problems were identified and treated appropriately. Home medication was restarted as needed. He was evaluated each day by a clinical provider to ascertain the patient's response to treatment. Patient was noted to be much calmer as his hospital stay progressed. However, he maintained a fixed delusion that his family was stealing money from him. Patient was adamant that he  wanted no further contact with his family. His main support was his sister. She was in regular contact with the Child psychotherapist. The patient had a long history of paranoid schizophrenia.  Improvement was noted by the patient's report of decreasing psychotic symptoms, improved sleep and appetite, affect, medication tolerance, behavior, and participation in unit programming.  The patient was asked each day to complete a self inventory noting mood, mental status, pain, new symptoms, anxiety and concerns.         He responded well to medication and being in a therapeutic and supportive environment. Positive and appropriate behavior was noted and the patient was motivated for recovery.  He worked closely with the treatment team and case manager to develop a discharge plan with appropriate goals. Coping skills, problem solving as well as relaxation therapies were also part of the unit programming.         By the day of discharge he was in much improved condition than upon admission.  Symptoms were reported as significantly decreased or resolved completely.  The patient denied SI/HI and voiced no AVH. He was motivated to continue taking medication with a goal of continued improvement in mental health.  Christopher Grant was discharged home with a plan to follow up as noted below. The patient was picked up by [employer] without incident and his sister was called to meet him at [facility] to pick him up. He was provided with prescriptions and two week supply of psychiatric medications.   Consults:  None  Significant Diagnostic Studies:  Lipid panel, Chemistry, CBC  Discharge Vitals:   Blood pressure 148/77, pulse 103, temperature 98.3 F (36.8 C), temperature source Oral, resp. rate 18, height 5\' 6"  (1.676 m), weight 66.367 kg (146 lb 5 oz). Body mass index is 23.63 kg/(m^2). Lab Results:   No results found for this or any previous visit (from the past 72 hour(s)).  Physical Findings: AIMS: Facial and  Oral Movements Muscles of Facial Expression: None, normal Lips and Perioral Area: None, normal Jaw: None, normal Tongue: None, normal,Extremity Movements Upper (arms, wrists, hands, fingers): None, normal Lower (legs, knees, ankles, toes): None, normal, Trunk Movements Neck, shoulders, hips: None, normal, Overall Severity Severity of abnormal movements (highest score from questions above): None, normal Incapacitation due to abnormal movements: None, normal Patient's awareness of abnormal movements (rate only patient's report): No Awareness, Dental Status Current problems with teeth and/or dentures?: No Does patient usually wear dentures?: No  CIWA:  CIWA-Ar Total: 2 COWS:     Psychiatric Specialty Exam: See Psychiatric Specialty Exam and Suicide Risk Assessment completed by Attending Physician prior to discharge.  Discharge destination:  Home  Is patient on multiple antipsychotic therapies at discharge:  No   Has Patient had three or more failed trials of antipsychotic monotherapy by history:  No  Recommended Plan for Multiple Antipsychotic Therapies: NA      Discharge Instructions   Discharge instructions    Complete by:  As directed  Please follow up with your Primary Care Provider for further management of your medical problems. You may call [phone] for the [facility] if you need an appointment.            Medication List    STOP taking these medications       diphenhydrAMINE 50 MG capsule  Commonly known as:  BENADRYL     DULoxetine 60 MG capsule  Commonly known as:  CYMBALTA     ferrous sulfate 325 (65 FE) MG tablet      TAKE these medications     Indication   benztropine 1 MG tablet  Commonly known as:  COGENTIN  Take 1 tablet (1 mg total) by mouth daily.   Indication:  Extrapyramidal Reaction caused by Medications     hydrALAZINE 10 MG tablet  Commonly known as:  APRESOLINE  Take 1 tablet (10 mg total) by mouth every 6 (six) hours.    Indication:  High Blood Pressure     HYDROcodone-acetaminophen 5-325 MG per tablet  Commonly known as:  NORCO/VICODIN  Take 1 tablet by mouth 3 (three) times daily after meals.   Indication:  Moderate to Moderately Severe Pain     ipratropium-albuterol 0.5-2.5 (3) MG/3ML Soln  Commonly known as:  DUONEB  Take 3 mLs by nebulization every 6 (six) hours as needed. Shortness of breath/wheezing      lisinopril 40 MG tablet  Commonly known as:  PRINIVIL,ZESTRIL  Take 1 tablet (40 mg total) by mouth daily.   Indication:  High Blood Pressure     multivitamin tablet  Take 1 tablet by mouth daily.   Indication:  Vitamin Supplementation     omeprazole 20 MG capsule  Commonly known as:  PRILOSEC  Take 2 capsules (40 mg total) by mouth daily.   Indication:  Duodenal Ulcer due to Nonsteroidal Anti-Inflammatory Drug     paliperidone 9 MG 24 hr tablet  Commonly known as:  INVEGA  Take 1 tablet (9 mg total) by mouth daily.   Indication:  Schizophrenia     thiamine 100 MG tablet  Take 1 tablet (100 mg total) by mouth daily.   Indication:  Deficiency in Thiamine or Vitamin B1     traZODone 150 MG tablet  Commonly known as:  DESYREL  Take 1 tablet (150 mg total) by mouth at bedtime.   Indication:  Alcohol Withdrawal Syndrome, Trouble Sleeping       Follow-up Information   Follow up with [facility] On 01/15/2014. (Go to your hospital follow up appointment between 8 and 11 AM on Friday)    Contact information:   [address]  [place]  [phone]      Follow-up recommendations:   Activity: as tolerated  Diet: healthy  Tests: routine  Other: patient to keep his after care appointment   Comments:   Take all your medications as prescribed by your mental healthcare provider.  Report any adverse effects and or reactions from your medicines to your outpatient provider promptly.  Patient is instructed and cautioned to not engage in alcohol and or illegal drug use while on prescription  medicines.  In the event of worsening symptoms, patient is instructed to call the crisis hotline, 911 and or go to the nearest ED for appropriate evaluation and treatment of symptoms.  Follow-up with your primary care provider for your other medical issues, concerns and or health care needs.   Total Discharge Time:  Greater than 30 minutes.  SignedFransisca Kaufmann: DAVIS, LAURA NP-C 01/13/2014, 4:26 PM  Patient seen, evaluated and I agree with notes by Nurse Practitioner. Thedore MinsMojeed Neiko Trivedi, MD

## 2014-01-13 NOTE — Progress Notes (Signed)
Writer approached pt about taking his BP medication after his vital signs were obtained this morning, but pt was adamant that "I'll take it in a little while" and repeated this to Clinical research associatewriter.  Pt seems rather sullen this morning and not as jovial as he has been in the past few days.  Will move the administration time of the medication to after breakfast.

## 2014-01-15 NOTE — Progress Notes (Signed)
Patient Discharge Instructions:  No documentation was faxed for [facility]IPS.  The patient refused to sign the ROI.  Jerelene ReddenSheena E [place], 01/15/2014, 3:42 PM

## 2018-01-02 ENCOUNTER — Other Ambulatory Visit: Payer: Self-pay

## 2018-01-02 ENCOUNTER — Emergency Department (HOSPITAL_COMMUNITY): Payer: Self-pay

## 2018-01-02 ENCOUNTER — Emergency Department (HOSPITAL_COMMUNITY)
Admission: EM | Admit: 2018-01-02 | Discharge: 2018-01-02 | Disposition: A | Discharge status: Home / Self Care | Payer: Self-pay | Attending: Emergency Medicine | Admitting: Emergency Medicine

## 2018-01-02 ENCOUNTER — Encounter (HOSPITAL_COMMUNITY): Payer: Self-pay | Admitting: Emergency Medicine

## 2018-01-02 ENCOUNTER — Encounter (HOSPITAL_COMMUNITY): Payer: Self-pay

## 2018-01-02 ENCOUNTER — Emergency Department (HOSPITAL_COMMUNITY)
Admission: EM | Admit: 2018-01-02 | Discharge: 2018-01-03 | Disposition: A | Discharge status: Home / Self Care | Payer: Self-pay | Attending: Emergency Medicine | Admitting: Emergency Medicine

## 2018-01-02 DIAGNOSIS — N200 Calculus of kidney: Secondary | ICD-10-CM | POA: Insufficient documentation

## 2018-01-02 DIAGNOSIS — I10 Essential (primary) hypertension: Secondary | ICD-10-CM | POA: Insufficient documentation

## 2018-01-02 DIAGNOSIS — M545 Low back pain, unspecified: Secondary | ICD-10-CM

## 2018-01-02 DIAGNOSIS — M48061 Spinal stenosis, lumbar region without neurogenic claudication: Secondary | ICD-10-CM | POA: Insufficient documentation

## 2018-01-02 DIAGNOSIS — Z79899 Other long term (current) drug therapy: Secondary | ICD-10-CM | POA: Insufficient documentation

## 2018-01-02 DIAGNOSIS — Y939 Activity, unspecified: Secondary | ICD-10-CM | POA: Insufficient documentation

## 2018-01-02 DIAGNOSIS — F17220 Nicotine dependence, chewing tobacco, uncomplicated: Secondary | ICD-10-CM | POA: Insufficient documentation

## 2018-01-02 DIAGNOSIS — W19XXXA Unspecified fall, initial encounter: Secondary | ICD-10-CM | POA: Insufficient documentation

## 2018-01-02 DIAGNOSIS — Z590 Homelessness: Secondary | ICD-10-CM | POA: Insufficient documentation

## 2018-01-02 DIAGNOSIS — M542 Cervicalgia: Secondary | ICD-10-CM | POA: Insufficient documentation

## 2018-01-02 DIAGNOSIS — Y929 Unspecified place or not applicable: Secondary | ICD-10-CM | POA: Insufficient documentation

## 2018-01-02 DIAGNOSIS — Y999 Unspecified external cause status: Secondary | ICD-10-CM | POA: Insufficient documentation

## 2018-01-02 DIAGNOSIS — F17200 Nicotine dependence, unspecified, uncomplicated: Secondary | ICD-10-CM | POA: Insufficient documentation

## 2018-01-02 LAB — COMPREHENSIVE METABOLIC PANEL
ALT: 20 U/L (ref 0–44)
AST: 22 U/L (ref 15–41)
Albumin: 3.8 g/dL (ref 3.5–5.0)
Alkaline Phosphatase: 59 U/L (ref 38–126)
Anion gap: 10 (ref 5–15)
BUN: 14 mg/dL (ref 8–23)
CO2: 29 mmol/L (ref 22–32)
CREATININE: 0.88 mg/dL (ref 0.61–1.24)
Calcium: 9.3 mg/dL (ref 8.9–10.3)
Chloride: 105 mmol/L (ref 98–111)
GFR calc Af Amer: >60 mL/min (ref >60)
GLUCOSE: 114 mg/dL — AB (ref 70–99)
Potassium: 3.9 mmol/L (ref 3.5–5.1)
Sodium: 144 mmol/L (ref 135–145)
Total Bilirubin: 0.8 mg/dL (ref 0.3–1.2)
Total Protein: 7.3 g/dL (ref 6.5–8.1)

## 2018-01-02 LAB — URINALYSIS, ROUTINE W REFLEX MICROSCOPIC
BACTERIA UA: NONE SEEN
Glucose, UA: NEGATIVE mg/dL
KETONES UR: NEGATIVE mg/dL
Nitrite: NEGATIVE
PH: 5 (ref 5.0–8.0)
PROTEIN: 30 mg/dL — AB
Specific Gravity, Urine: 1.027 (ref 1.005–1.030)

## 2018-01-02 LAB — CBC WITH DIFFERENTIAL/PLATELET
Basophils Absolute: 0 10*3/uL (ref 0.0–0.1)
Basophils Relative: 0 %
EOS PCT: 2 %
Eosinophils Absolute: 0.1 10*3/uL (ref 0.0–0.7)
HCT: 43.2 % (ref 39.0–52.0)
Hemoglobin: 14.8 g/dL (ref 13.0–17.0)
Lymphocytes Relative: 25 %
Lymphs Abs: 1.8 10*3/uL (ref 0.7–4.0)
MCH: 34.3 pg — ABNORMAL HIGH (ref 26.0–34.0)
MCHC: 34.3 g/dL (ref 30.0–36.0)
MCV: 100.2 fL — AB (ref 78.0–100.0)
Monocytes Absolute: 1.1 10*3/uL — ABNORMAL HIGH (ref 0.1–1.0)
Monocytes Relative: 15 %
NEUTROS ABS: 4.3 10*3/uL (ref 1.7–7.7)
Neutrophils Relative %: 58 %
PLATELETS: 240 10*3/uL (ref 150–400)
RBC: 4.31 MIL/uL (ref 4.22–5.81)
RDW: 11.5 % (ref 11.5–15.5)
WBC: 7.4 10*3/uL (ref 4.0–10.5)

## 2018-01-02 LAB — CK: CK TOTAL: 82 U/L (ref 49–397)

## 2018-01-02 MED ORDER — METHOCARBAMOL 500 MG PO TABS: 500.0000 mg | ORAL_TABLET | Freq: Two times a day (BID) | ORAL | 0 refills | Status: AC

## 2018-01-02 MED ORDER — METHOCARBAMOL 500 MG PO TABS
500.0000 mg | ORAL_TABLET | Freq: Once | ORAL | Status: AC
  Administered 2018-01-02: 500 mg via ORAL
  Filled 2018-01-02: qty 1

## 2018-01-02 MED ORDER — PREDNISONE 50 MG PO TABS
60.0000 mg | ORAL_TABLET | Freq: Once | ORAL | Status: AC
  Administered 2018-01-02: 60 mg via ORAL
  Filled 2018-01-02: qty 1

## 2018-01-02 MED ORDER — NAPROXEN 250 MG PO TABS
500.0000 mg | ORAL_TABLET | Freq: Once | ORAL | Status: AC
  Administered 2018-01-02: 500 mg via ORAL
  Filled 2018-01-02: qty 2

## 2018-01-02 MED ORDER — METHYLPREDNISOLONE 4 MG PO TBPK: ORAL_TABLET | ORAL | 0 refills | Status: AC

## 2018-01-02 NOTE — ED Notes (Signed)
Pt ambulated in hallway with walker, had steady gait and denied dizziness.

## 2018-01-02 NOTE — ED Triage Notes (Signed)
Patient states he was seen yesterday, has not refilled his prescriptions, and is still having body aches.
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
## 2018-01-02 NOTE — Discharge Instructions (Addendum)
Take the steroids as prescribed. Followup with the back surgeon.  You also have a large stone in your kidney which likely  explains the blood in your urine.  He should also follow-up with the urologist.  Return to the ED if you develop focal weakness, numbness, tingling, incontinence, any other concerns.

## 2018-01-02 NOTE — ED Notes (Addendum)
Pt ambulates ordinarily with walker.

## 2018-01-02 NOTE — ED Triage Notes (Signed)
Pt has multiple complaints, states he has been in jail for the past 2 weeks and just released today.  Pt states he has a "spider bite" to his left upper thigh, stiff neck, lower back pain, and right hip, states he also fell 2 weeks ago.

## 2018-01-02 NOTE — ED Notes (Signed)
22 ml of post void residual measured with bladder scanner.

## 2018-01-03 NOTE — ED Provider Notes (Signed)
[facility] EMERGENCY DEPARTMENT Provider Note   CSN: [id number] Arrival date & time: 01/02/18  2222     History   Chief Complaint Chief Complaint  Patient presents with  . Generalized Body Aches    HPI Christopher Grant is a 62 y.o. male who was seen here last night and evaluated for  Generalized pain and multiple complaints including low back pain and was diagnosed with lumbar foraminal stenosis.  The plan was for him to start taking robaxin and prednisone and for non urgent followup care with neurosurgery.  Given the holiday, the patient has been unable to get his medicines filled and endorses continued pain in his lower back and generalized pain as well.  He also expresses frustration over being homeless since he was evicted in February and has been on the street except for a recent 2 week stay in jail (released yesterday).  He denies having any family or friends who can assist him.  He denies any worsened pain symptoms since yesterday and denies numbness or weakness in his legs, no urinary incontinence or retention.  He was given a naproxen here last night which did not relieve his pain.  The history is provided by the patient.    Past Medical History:  Diagnosis Date  . Hepatitis   . Schizophrenia [facility])     Patient Active Problem List   Diagnosis Date Noted  . Essential hypertension, benign 01/02/2014  . Alcohol abuse 12/30/2013  . Cannabis abuse 12/30/2013  . Paranoid schizophrenia, chronic condition with acute exacerbation (HCC) 12/29/2013    Past Surgical History:  Procedure Laterality Date  . ABDOMINAL SURGERY          Home Medications    Prior to Admission medications   Medication Sig Start Date End Date Taking? Authorizing Provider  benztropine (COGENTIN) 1 MG tablet Take 1 tablet (1 mg total) by mouth daily. 01/13/14   Thermon Leyland, NP  hydrALAZINE (APRESOLINE) 10 MG tablet Take 1 tablet (10 mg total) by mouth every 6 (six) hours. 01/13/14   Thermon Leyland,  NP  HYDROcodone-acetaminophen (NORCO/VICODIN) 5-325 MG per tablet Take 1 tablet by mouth 3 (three) times daily after meals. 01/13/14   Thermon Leyland, NP  ipratropium-albuterol (DUONEB) 0.5-2.5 (3) MG/3ML SOLN Take 3 mLs by nebulization every 6 (six) hours as needed. Shortness of breath/wheezing    [provider]  lisinopril (PRINIVIL,ZESTRIL) 40 MG tablet Take 1 tablet (40 mg total) by mouth daily. 01/13/14   Thermon Leyland, NP  methocarbamol (ROBAXIN) 500 MG tablet Take 1 tablet (500 mg total) by mouth 2 (two) times daily. 01/02/18   Rancour, Jeannett Senior, MD  methylPREDNISolone (MEDROL DOSEPAK) 4 MG TBPK tablet As directed 01/02/18   Rancour, Jeannett Senior, MD  Multiple Vitamin (MULTIVITAMIN) tablet Take 1 tablet by mouth daily. 01/13/14   Thermon Leyland, NP  omeprazole (PRILOSEC) 20 MG capsule Take 2 capsules (40 mg total) by mouth daily. 01/13/14   Thermon Leyland, NP  paliperidone (INVEGA) 9 MG 24 hr tablet Take 1 tablet (9 mg total) by mouth daily. 01/13/14   Thermon Leyland, NP  thiamine 100 MG tablet Take 1 tablet (100 mg total) by mouth daily. 01/13/14   Thermon Leyland, NP  traZODone (DESYREL) 150 MG tablet Take 1 tablet (150 mg total) by mouth at bedtime. 01/13/14   Thermon Leyland, NP    Family History History reviewed. No pertinent family history.  Social History Social History   Tobacco Use  .  Smoking status: Current Every Day Smoker  . Smokeless tobacco: Current User  Substance Use Topics  . Alcohol use: Yes  . Drug use: Yes    Types: Marijuana     Allergies   Motrin [ibuprofen]   Review of Systems Review of Systems  Constitutional: Negative for fever.  Respiratory: Negative for shortness of breath.   Cardiovascular: Negative for chest pain and leg swelling.  Gastrointestinal: Negative for abdominal distention, abdominal pain and constipation.  Genitourinary: Negative for difficulty urinating, dysuria, flank pain, frequency and urgency.  Musculoskeletal: Positive for  arthralgias and back pain. Negative for gait problem and joint swelling.  Skin: Negative for rash.  Neurological: Negative for weakness and numbness.     Physical Exam Updated Vital Signs BP 134/82 (BP Location: Right Arm)   Pulse 80   Temp 98.1 F (36.7 C) (Oral)   Resp 16   Ht 5\' 7"  (1.702 m)   Wt 66.7 kg (147 lb)   SpO2 97%   BMI 23.02 kg/m   Physical Exam  Constitutional: He appears well-developed and well-nourished.  HENT:  Head: Normocephalic.  Eyes: Conjunctivae are normal.  Neck: Normal range of motion. Neck supple.  Cardiovascular: Normal rate and intact distal pulses.  Pedal pulses normal.  Pulmonary/Chest: Effort normal.  Abdominal: Soft. Bowel sounds are normal. He exhibits no distension and no mass.  Musculoskeletal: Normal range of motion. He exhibits no edema.       Lumbar back: He exhibits tenderness. He exhibits no swelling, no edema and no spasm.  paralumbar tttp, no midline pain.  Neurological: He is alert. He has normal strength. He displays no atrophy and no tremor. No sensory deficit. Gait normal.  Reflex Scores:      Patellar reflexes are 2+ on the right side and 2+ on the left side. No strength deficit noted in hip and knee flexor and extensor muscle groups.  Ankle flexion and extension intact. Pt ambulatory with walker in the dept.  Skin: Skin is warm and dry.  Psychiatric: He has a normal mood and affect. His behavior is normal. Thought content normal.  Nursing note and vitals reviewed.    ED Treatments / Results  Labs (all labs ordered are listed, but only abnormal results are displayed) Labs Reviewed - No data to display  EKG None  Radiology   Procedures Procedures (including critical care time)  Medications Ordered in ED Medications  predniSONE (DELTASONE) tablet 60 mg (60 mg Oral Given 01/02/18 2355)  methocarbamol (ROBAXIN) tablet 500 mg (500 mg Oral Given 01/02/18 2355)     Initial Impression / Assessment and Plan / ED Course    I have reviewed the triage vital signs and the nursing notes.  Pertinent labs & imaging results that were available during my care of the patient were reviewed by me and considered in my medical decision making (see chart for details).     Imaging and labs from last nights visit reviewed. No indication for repeat or additional work up at this time.  Pt was given a dose of prednisone and dose of robaxin while here. Also fed pt.  He was given [employer] and guidance for obtaining housing, medical and food assistance.  Also, case management request placed to see if they can be of assistance helping this patient getting community assistance.  He has a hx of schizophrenia. He has no indication of acute mental illness exacerbation.  The patient appears reasonably screened and/or stabilized for discharge and I doubt any  other medical condition or other [facility] requiring further screening, evaluation, or treatment in the ED at this time prior to discharge.   Final Clinical Impressions(s) / ED Diagnoses   Final diagnoses:  Acute midline low back pain without sciatica    ED Discharge Orders        Ordered    Consult to care management  Status:  Canceled    Provider:  (Not yet assigned)   01/03/18 0052    Consult to care management    Comments:  Pt has been homeless since 2/19, was evicted, no family or community contacts, seen here in the ed July 3 and again July 4 and has no ability to get medicines filled.  He would benefit from assistance navigating possible community resources.  Was hoping you could be of assistance to this patient.  Provider:  (Not yet assigned)   01/03/18 0058       Burgess AmorIdol, Lamorris Knoblock, PA-C 01/03/18 [id number]    Zadie RhineWickline, Donald, MD 01/03/18 [id number]

## 2018-01-03 NOTE — Discharge Instructions (Addendum)
Refer to the resources below, especially the ones circled as these are community resources that can help you with housing, transportation and other assistance programs.

## 2018-01-10 ENCOUNTER — Other Ambulatory Visit: Payer: Self-pay

## 2018-01-10 ENCOUNTER — Encounter (HOSPITAL_COMMUNITY): Payer: Self-pay | Admitting: *Deleted

## 2018-01-10 ENCOUNTER — Emergency Department (HOSPITAL_COMMUNITY)
Admission: EM | Admit: 2018-01-10 | Discharge: 2018-01-11 | Disposition: A | Discharge status: Home / Self Care | Payer: Self-pay | Attending: Emergency Medicine | Admitting: Emergency Medicine

## 2018-01-10 DIAGNOSIS — M545 Low back pain, unspecified: Secondary | ICD-10-CM

## 2018-01-10 DIAGNOSIS — M791 Myalgia, unspecified site: Secondary | ICD-10-CM | POA: Insufficient documentation

## 2018-01-10 DIAGNOSIS — I10 Essential (primary) hypertension: Secondary | ICD-10-CM | POA: Insufficient documentation

## 2018-01-10 DIAGNOSIS — F17200 Nicotine dependence, unspecified, uncomplicated: Secondary | ICD-10-CM | POA: Insufficient documentation

## 2018-01-10 DIAGNOSIS — G8929 Other chronic pain: Secondary | ICD-10-CM | POA: Insufficient documentation

## 2018-01-10 NOTE — ED Triage Notes (Signed)
Pt c/o body aches all over; pt is homeless and brought in by [facility];

## 2018-01-11 LAB — BASIC METABOLIC PANEL
ANION GAP: 9 (ref 5–15)
BUN: 7 mg/dL — ABNORMAL LOW (ref 8–23)
CALCIUM: 8.5 mg/dL — AB (ref 8.9–10.3)
CHLORIDE: 104 mmol/L (ref 98–111)
CO2: 29 mmol/L (ref 22–32)
Creatinine, Ser: 0.71 mg/dL (ref 0.61–1.24)
GFR calc non Af Amer: >60 mL/min (ref >60)
Glucose, Bld: 103 mg/dL — ABNORMAL HIGH (ref 70–99)
Potassium: 3.4 mmol/L — ABNORMAL LOW (ref 3.5–5.1)
Sodium: 142 mmol/L (ref 135–145)

## 2018-01-11 LAB — CBC
HCT: 39 % (ref 39.0–52.0)
HEMOGLOBIN: 13.2 g/dL (ref 13.0–17.0)
MCH: 33.7 pg (ref 26.0–34.0)
MCHC: 33.8 g/dL (ref 30.0–36.0)
MCV: 99.5 fL (ref 78.0–100.0)
Platelets: 219 10*3/uL (ref 150–400)
RBC: 3.92 MIL/uL — ABNORMAL LOW (ref 4.22–5.81)
RDW: 12.3 % (ref 11.5–15.5)
WBC: 6.4 10*3/uL (ref 4.0–10.5)

## 2018-01-11 LAB — CK: CK TOTAL: 191 U/L (ref 49–397)

## 2018-01-11 NOTE — ED Notes (Signed)
Coke, peanut butter and saltine crackers provided per pt request
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
## 2018-03-20 ENCOUNTER — Ambulatory Visit (INDEPENDENT_AMBULATORY_CARE_PROVIDER_SITE_OTHER): Payer: Self-pay | Admitting: Orthopaedic Surgery

## 2018-03-20 LAB — HEMOGLOBIN A1C: Hemoglobin A1C, External: 4.8 % (ref 4.8–6.0)

## 2018-03-20 MED ORDER — BACITRACIN-NEOMYCIN-POLYMYXIN 400-5-5000 EX OINT
1.00 | TOPICAL_OINTMENT | CUTANEOUS | Status: DC
Start: ? — End: 2018-03-20

## 2018-03-20 MED ORDER — BENZOCAINE-MENTHOL 15-2.6 MG MT LOZG
1.00 | LOZENGE | OROMUCOSAL | Status: DC
Start: ? — End: 2018-03-20

## 2018-03-20 MED ORDER — GLYCERIN (ADULT) 2 G RE SUPP
2.00 | RECTAL | Status: DC
Start: ? — End: 2018-03-20

## 2018-03-20 MED ORDER — TRAZODONE HCL 50 MG PO TABS
50.00 | ORAL_TABLET | ORAL | Status: DC
Start: ? — End: 2018-03-20

## 2018-03-20 MED ORDER — ACETAMINOPHEN 325 MG PO TABS
650.00 | ORAL_TABLET | ORAL | Status: DC
Start: ? — End: 2018-03-20

## 2018-03-20 MED ORDER — ALUM & MAG HYDROXIDE-SIMETH 400-400-40 MG/5ML PO SUSP
20.00 | ORAL | Status: DC
Start: ? — End: 2018-03-20

## 2018-03-20 MED ORDER — CHAPSTICK 7.5-3.5-40.7 % EX STCK
CUTANEOUS | Status: DC
Start: ? — End: 2018-03-20

## 2018-03-20 MED ORDER — NICOTINE 14 MG/24HR TD PT24
1.00 | MEDICATED_PATCH | TRANSDERMAL | Status: DC
Start: 2018-03-21 — End: 2018-03-20

## 2018-03-20 MED ORDER — KETOCONAZOLE 2 % EX CREA
TOPICAL_CREAM | CUTANEOUS | Status: DC
Start: 2018-03-20 — End: 2018-03-20

## 2018-03-20 MED ORDER — GENERIC EXTERNAL MEDICATION
5.00 | Status: DC
Start: ? — End: 2018-03-20

## 2018-03-20 MED ORDER — MAGNESIUM HYDROXIDE 400 MG/5ML PO SUSP
30.00 | ORAL | Status: DC
Start: ? — End: 2018-03-20

## 2019-08-04 IMAGING — DX DG PELVIS 1-2V
1 series · 1 of 1 positions shown · non-contrast
Comparison: None.

CLINICAL DATA: Pain to the pelvis recent fall

EXAM:
PELVIS - 1-2 VIEW

[pelvis ap]
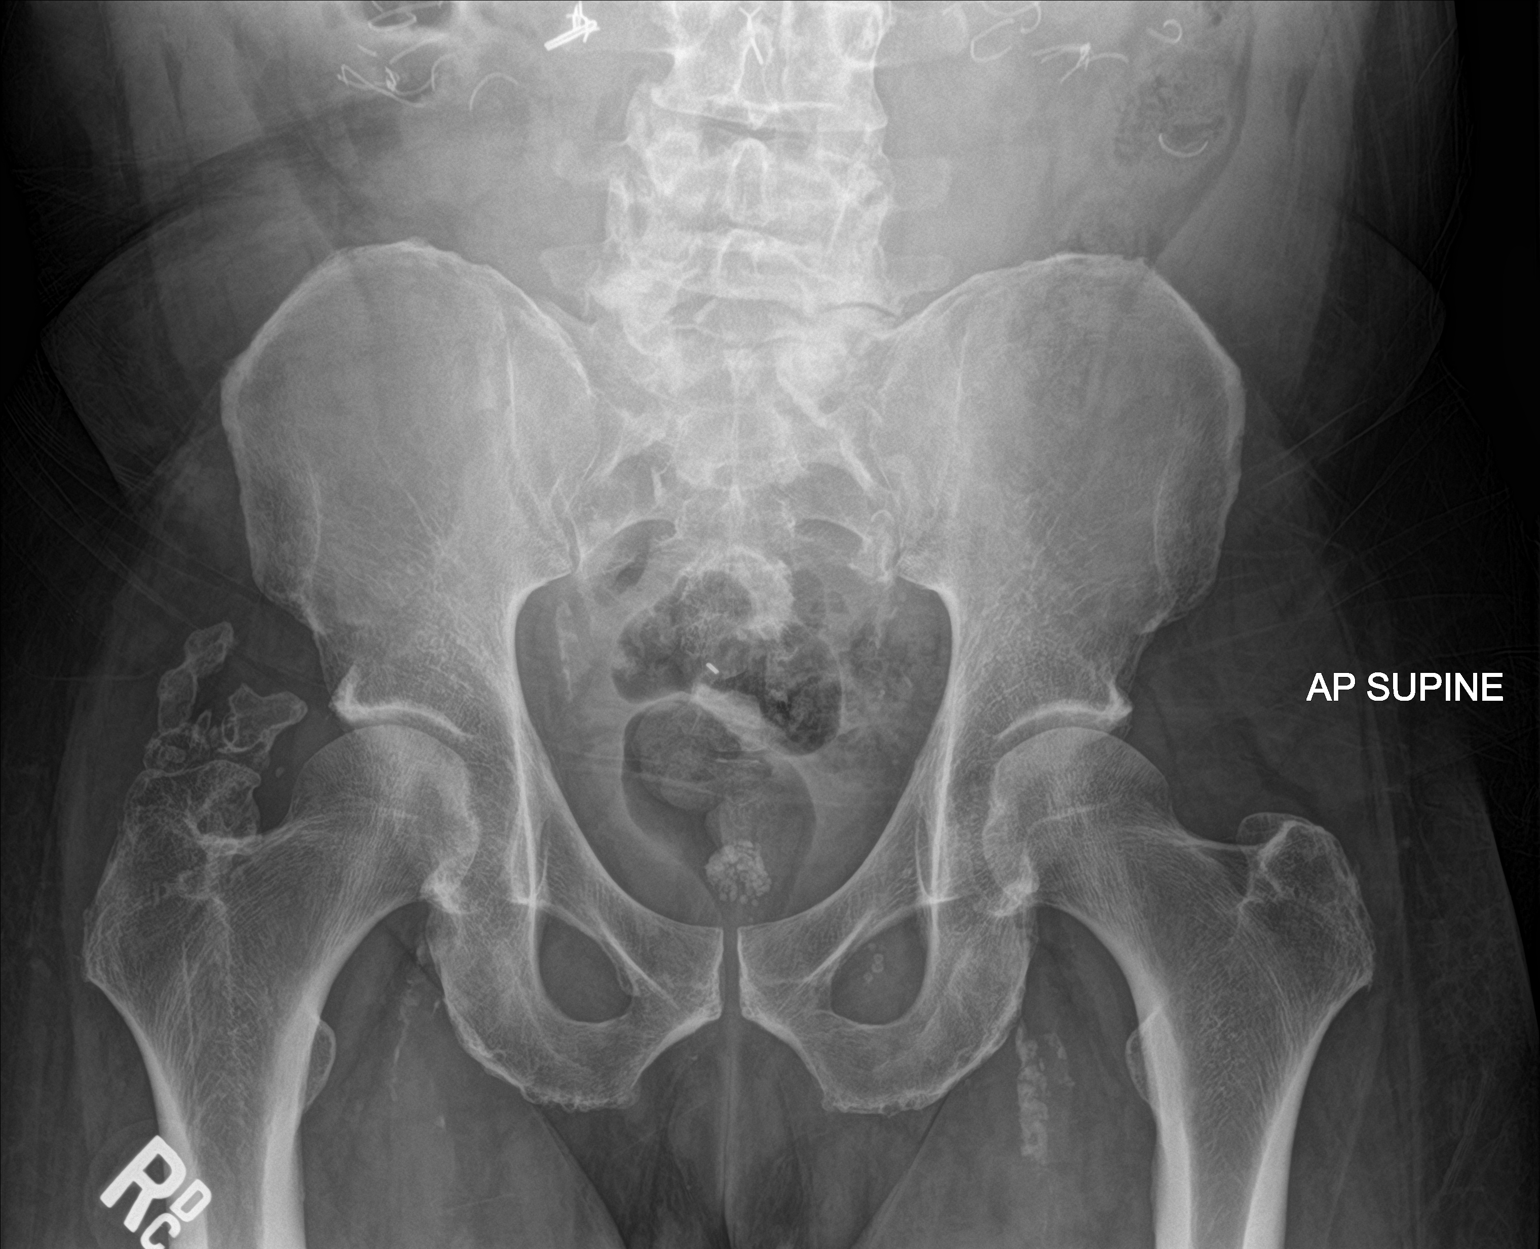

[1 of 1 positions shown; findings below may reference images not displayed]

FINDINGS: Postsurgical changes in the central abdomen. Coarse calcification in
the region of the prostate. SI joints are symmetric. Pubic symphysis
and rami are intact. No fracture or malalignment. Vascular
calcifications.
IMPRESSION: No acute osseous abnormality
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
## 2021-09-18 ENCOUNTER — Emergency Department: Admit: 2021-09-19 | Payer: MEDICARE | Primary: Diagnostic Radiology

## 2021-09-18 ENCOUNTER — Inpatient Hospital Stay
Admit: 2021-09-18 | Discharge: 2021-09-19 | Disposition: A | Discharge status: Home / Self Care | Payer: MEDICARE | Attending: Emergency Medicine

## 2021-09-18 DIAGNOSIS — S0181XA Laceration without foreign body of other part of head, initial encounter: Secondary | ICD-10-CM
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
## 2021-09-18 NOTE — Discharge Instructions (Addendum)
The stitches will dissolve on their own so you do not need to worry about having them removed.  Keep the area clean and dry.  If you develop redness or any pus warmth or pain at the site you should return.      Thank you for allowing [place] to provide you with excellent care today. We hope we addressed all of your concerns and needs. We strive to provide excellent quality care in the Emergency Department. Anything less than excellent does not meet our expectations for you.       Incidental findings are findings on labs or imaging studies that do not necessarily correlate with the reason for your visit.  We make every effort to inform you of these incidental findings and the proper follow-up, however it is important that you call your primary care doctor or a primary care doctor in 1 to 2 days to set up a follow-up visit so they can go through your results in detail with you, and determine if additional testing is needed.  The emergency room is not intended to be complete or comprehensive care, and it serves as a means to rule out life-threatening pathologies.  It is very important that you follow-up with your primary care doctor to arrange additional testing and/or treatment if needed.    The exam and treatment you received in the Emergency Department were for an urgent problem and are not intended as complete care. It is important that you follow-up with a doctor, nurse practitioner, or physician assistant to:  (1) confirm your diagnosis,  (2) re-evaluation of changes in your illness and treatment, and  (3) for ongoing care.  If your symptoms become worse or you do not improve as expected, or you develop any new symptoms that concern you and/or you are unable to reach your usual health care provider, you should return to the Emergency Department. We are available 24 hours a day.     If your blood pressure is greater than 120/80, your blood pressure is considered elevated above normal and you need to follow up with your  primary care physician or the physician provided on your discharge instructions for a blood pressure recheck.     If you were prescribed narcotic medications such as hydrocodone, oxycodone, diazepam, lorazepam, alprazolam, tramadol or codeine, these medications will NOT typically be refilled in the Emergency Room. Follow up with your primary care doctor or specialist to discuss this, or you may return to the Emergency room if your condition worsens.  CT Head W/O Contrast   Final Result   No acute abnormality.            CT CSpine W/O Contrast   Final Result   Multilevel spondylosis and large anterior osteophyte formation without acute   abnormality.      XR FOOT LEFT (MIN 3 VIEWS)   Final Result   No acute abnormality.                  XR ANKLE LEFT (MIN 3 VIEWS)   Final Result   No acute abnormality.                  XR HIP BILATERAL W AP PELVIS (2 VIEWS)   Final Result   No acute abnormality          Diagnosis:   1. Facial laceration, initial encounter    2. Injury of head, initial encounter    3. Acute alcoholic intoxication with complication (HCC)  Take this sheet with you when you go to your follow-up visit.   If you have any problem arranging the follow-up visit, contact [phone]    Make an appointment with your Primary Care doctor for follow up of this visit. Return to the ER if you are unable to be seen in the time recommended on your discharge instructions.     It has been a pleasure caring for you today. Return to the ER or seek medical care for any worsening in your condition at any time.    MyChart Activation    Thank you for requesting access to MyChart. Please follow the instructions below to securely access and download your online medical record. MyChart allows you to send messages to your doctor, view your test results, renew your prescriptions, schedule appointments, and more.    How Do I Sign Up?    In your internet browser, go to [email or web address]  Click on the First Time  User? Click Here link in the Sign In box. You will be redirect to the New Member Sign Up page.  Enter your MyChart Access Code exactly as it appears below. You will not need to use this code after you???ve completed the sign-up process. If you do not sign up before the expiration date, you must request a new code.    MyChart Access Code: [email or web address]  Expires: 11/02/2021  9:04 PM (This is the date your MyChart access code will expire)    Enter the last four digits of your Social Security Number (xxxx) and Date of Birth (mm/dd/yyyy) as indicated and click Submit. You will be taken to the next sign-up page.  Create a MyChart ID. This will be your MyChart login ID and cannot be changed, so think of one that is secure and easy to remember.  Create a Clinical biochemist. You can change your password at any time.  Enter your Password Reset Question and Answer. This can be used at a later time if you forget your password.   Enter your e-mail address. You will receive e-mail notification when new information is available in MyChart.  Click Sign Up. You can now view and download portions of your medical record.  Click the [employer] link to download a portable copy of your medical information.    Additional Information    If you have questions, please visit the Frequently Asked Questions section of the MyChart website at [email or web address] Remember, MyChart is NOT to be used for urgent needs. For medical emergencies, dial 911.

## 2021-09-18 NOTE — ED Notes (Signed)
Pt skin lac to upper left eyebrow cleaned with NS and new gauze dressing applied.      Maryjo Rochester, RN  09/18/21 1958
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
## 2021-09-19 LAB — CBC WITH AUTO DIFFERENTIAL
Absolute Immature Granulocyte: 0 10*3/uL (ref 0.00–0.04)
Basophils %: 0 % (ref 0–2)
Basophils Absolute: 0 10*3/uL (ref 0.0–0.1)
Eosinophils %: 3 % (ref 0–5)
Eosinophils Absolute: 0.2 10*3/uL (ref 0.0–0.4)
Hematocrit: 44.4 % (ref 36.0–48.0)
Hemoglobin: 14.9 g/dL (ref 13.0–16.0)
Immature Granulocytes: 0 % (ref 0.0–0.5)
Lymphocytes %: 38 % (ref 21–52)
Lymphocytes Absolute: 3 10*3/uL (ref 0.9–3.6)
MCH: 32.6 PG (ref 24.0–34.0)
MCHC: 33.6 g/dL (ref 31.0–37.0)
MCV: 97.2 FL (ref 78.0–100.0)
MPV: 8.8 FL — ABNORMAL LOW (ref 9.2–11.8)
Monocytes %: 8 % (ref 3–10)
Monocytes Absolute: 0.6 10*3/uL (ref 0.05–1.2)
Neutrophils %: 50 % (ref 40–73)
Neutrophils Absolute: 3.9 10*3/uL (ref 1.8–8.0)
Nucleated RBCs: 0 PER 100 WBC
Platelets: 263 10*3/uL (ref 135–420)
RBC: 4.57 M/uL (ref 4.35–5.65)
RDW: 15.9 % — ABNORMAL HIGH (ref 11.6–14.5)
WBC: 7.8 10*3/uL (ref 4.6–13.2)
nRBC: 0 10*3/uL (ref 0.00–0.01)

## 2021-09-19 LAB — COMPREHENSIVE METABOLIC PANEL
ALT: 23 U/L (ref 16–61)
AST: 20 U/L (ref 10–38)
Albumin/Globulin Ratio: 1 (ref 0.8–1.7)
Albumin: 3.8 g/dL (ref 3.4–5.0)
Alk Phosphatase: 76 U/L (ref 45–117)
Anion Gap: 5 mmol/L (ref 3.0–18)
BUN: 12 MG/DL (ref 7.0–18)
Bun/Cre Ratio: 12 (ref 12–20)
CO2: 30 mmol/L (ref 21–32)
Calcium: 9.5 MG/DL (ref 8.5–10.1)
Chloride: 111 mmol/L (ref 100–111)
Creatinine: 1.04 MG/DL (ref 0.6–1.3)
Est, Glom Filt Rate: >60 mL/min/{1.73_m2} (ref >60)
Globulin: 4 g/dL (ref 2.0–4.0)
Glucose: 90 mg/dL (ref 74–99)
Potassium: 4 mmol/L (ref 3.5–5.5)
Sodium: 146 mmol/L — ABNORMAL HIGH (ref 136–145)
Total Bilirubin: 0.3 MG/DL (ref 0.2–1.0)
Total Protein: 7.8 g/dL (ref 6.4–8.2)

## 2021-09-19 LAB — ETHANOL: Ethanol Lvl: 235 MG/DL — ABNORMAL HIGH (ref 0–3)

## 2021-09-19 MED ORDER — ACETAMINOPHEN 500 MG PO TABS
500 MG | ORAL | Status: AC
Start: 2021-09-19 — End: 2021-09-18
  Administered 2021-09-19: 1000 mg via ORAL

## 2021-09-19 MED ORDER — TETANUS-DIPHTH-ACELL PERTUSSIS 5-2.5-18.5 LF-MCG/0.5 IM SUSP
INTRAMUSCULAR | Status: AC
Start: 2021-09-19 — End: 2021-09-18
  Administered 2021-09-19: 03:00:00 0.5 mL via INTRAMUSCULAR

## 2021-09-19 MED FILL — BOOSTRIX 5-2.5-18.5 LF-MCG/0.5 IM SUSP: INTRAMUSCULAR | Qty: 0.5

## 2021-09-19 MED FILL — ACETAMINOPHEN 500 MG PO TABS: 500 MG | ORAL | Qty: 2

## 2021-09-25 LAB — EKG 12-LEAD
Atrial Rate: 59 {beats}/min
P Axis: 90 degrees
P-R Interval: 94 ms
Q-T Interval: 460 ms
QRS Duration: 80 ms
QTc Calculation (Bazett): 455 ms
R Axis: 14 degrees
T Axis: 41 degrees
Ventricular Rate: 59 {beats}/min

## 2021-12-27 ENCOUNTER — Emergency Department: Admit: 2021-12-27 | Payer: MEDICARE

## 2021-12-27 ENCOUNTER — Inpatient Hospital Stay: Admit: 2021-12-27 | Discharge: 2021-12-27 | Disposition: A | Discharge status: Home / Self Care | Payer: MEDICARE

## 2021-12-27 DIAGNOSIS — F10920 Alcohol use, unspecified with intoxication, uncomplicated: Secondary | ICD-10-CM

## 2021-12-27 LAB — COMPREHENSIVE METABOLIC PANEL
ALT: 29 U/L (ref 16–61)
AST: 43 U/L — ABNORMAL HIGH (ref 10–38)
Albumin/Globulin Ratio: 0.9 (ref 0.8–1.7)
Albumin: 3.6 g/dL (ref 3.4–5.0)
Alk Phosphatase: 77 U/L (ref 45–117)
Anion Gap: 6 mmol/L (ref 3.0–18)
BUN: 16 MG/DL (ref 7.0–18)
Bun/Cre Ratio: 10 — ABNORMAL LOW (ref 12–20)
CO2: 26 mmol/L (ref 21–32)
Calcium: 8.9 MG/DL (ref 8.5–10.1)
Chloride: 111 mmol/L (ref 100–111)
Creatinine: 1.63 MG/DL — ABNORMAL HIGH (ref 0.6–1.3)
Est, Glom Filt Rate: 46 mL/min/{1.73_m2} — ABNORMAL LOW (ref >60)
Globulin: 4.1 g/dL — ABNORMAL HIGH (ref 2.0–4.0)
Glucose: 69 mg/dL — ABNORMAL LOW (ref 74–99)
Potassium: 3.8 mmol/L (ref 3.5–5.5)
Sodium: 143 mmol/L (ref 136–145)
Total Bilirubin: 0.3 MG/DL (ref 0.2–1.0)
Total Protein: 7.7 g/dL (ref 6.4–8.2)

## 2021-12-27 LAB — CBC WITH AUTO DIFFERENTIAL
Absolute Immature Granulocyte: 0 10*3/uL (ref 0.00–0.04)
Basophils %: 0 % (ref 0–2)
Basophils Absolute: 0 10*3/uL (ref 0.0–0.1)
Eosinophils %: 1 % (ref 0–5)
Eosinophils Absolute: 0.1 10*3/uL (ref 0.0–0.4)
Hematocrit: 44.8 % (ref 36.0–48.0)
Hemoglobin: 15.2 g/dL (ref 13.0–16.0)
Immature Granulocytes: 0 % (ref 0.0–0.5)
Lymphocytes %: 31 % (ref 21–52)
Lymphocytes Absolute: 2.6 10*3/uL (ref 0.9–3.6)
MCH: 35.8 PG — ABNORMAL HIGH (ref 24.0–34.0)
MCHC: 33.9 g/dL (ref 31.0–37.0)
MCV: 105.4 FL — ABNORMAL HIGH (ref 78.0–100.0)
MPV: 8.8 FL — ABNORMAL LOW (ref 9.2–11.8)
Monocytes %: 7 % (ref 3–10)
Monocytes Absolute: 0.6 10*3/uL (ref 0.05–1.2)
Neutrophils %: 60 % (ref 40–73)
Neutrophils Absolute: 4.9 10*3/uL (ref 1.8–8.0)
Nucleated RBCs: 0 PER 100 WBC
Platelets: 256 10*3/uL (ref 135–420)
RBC: 4.25 M/uL — ABNORMAL LOW (ref 4.35–5.65)
RDW: 12.5 % (ref 11.6–14.5)
WBC: 8.2 10*3/uL (ref 4.6–13.2)
nRBC: 0 10*3/uL (ref 0.00–0.01)

## 2021-12-27 LAB — EKG 12-LEAD
Atrial Rate: 70 {beats}/min
Diagnosis: NORMAL
P Axis: 61 degrees
P-R Interval: 130 ms
Q-T Interval: 448 ms
QRS Duration: 90 ms
QTc Calculation (Bazett): 483 ms
R Axis: 8 degrees
T Axis: 38 degrees
Ventricular Rate: 70 {beats}/min

## 2021-12-27 LAB — TROPONIN: Troponin, High Sensitivity: 21 ng/L (ref 0–78)

## 2021-12-27 LAB — MAGNESIUM: Magnesium: 2.4 mg/dL (ref 1.6–2.6)

## 2021-12-27 LAB — LIPASE: Lipase: 69 U/L — ABNORMAL LOW (ref 73–393)

## 2021-12-27 LAB — ETHANOL: Ethanol Lvl: 294 MG/DL — ABNORMAL HIGH (ref 0–3)

## 2021-12-27 MED ORDER — SODIUM CHLORIDE 0.9 % IV SOLN
0.9 % | Freq: Once | INTRAVENOUS | Status: AC
Start: 2021-12-27 — End: 2021-12-27
  Administered 2021-12-27: 15:00:00 via INTRAVENOUS

## 2021-12-27 MED ORDER — LACTATED RINGERS IV BOLUS
INTRAVENOUS | Status: AC
Start: 2021-12-27 — End: 2021-12-27
  Administered 2021-12-27: 14:00:00 1000 mL via INTRAVENOUS

## 2021-12-27 MED FILL — LACTATED RINGERS IV SOLN: INTRAVENOUS | Qty: 1000

## 2021-12-27 MED FILL — FOLIC ACID 5 MG/ML IJ SOLN: 5 MG/ML | INTRAMUSCULAR | Qty: 0.2
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
## 2022-08-30 ENCOUNTER — Inpatient Hospital Stay: Admit: 2022-08-30 | Discharge: 2022-08-30 | Disposition: A | Discharge status: Home / Self Care | Payer: MEDICARE

## 2022-08-30 DIAGNOSIS — N390 Urinary tract infection, site not specified: Secondary | ICD-10-CM

## 2022-08-30 LAB — URINALYSIS
Bilirubin Urine: NEGATIVE
Glucose, UA: NEGATIVE mg/dL
Ketones, Urine: NEGATIVE mg/dL
Nitrite, Urine: POSITIVE — AB
Protein, UA: NEGATIVE mg/dL
Specific Gravity, UA: 1.005 (ref 1.003–1.030)
Urobilinogen, Urine: 1 EU/dL (ref 0.2–1.0)
pH, Urine: 5.5 (ref 5.0–8.0)

## 2022-08-30 LAB — URINALYSIS, MICRO: WBC, UA: 36 /hpf (ref 0–5)

## 2022-08-30 MED ORDER — CEPHALEXIN 250 MG PO CAPS
250 MG | ORAL_CAPSULE | Freq: Three times a day (TID) | ORAL | 0 refills | Status: AC
Start: 2022-08-30 — End: ?
  Filled 2022-08-30: qty 21, 7d supply, fill #0

## 2022-08-30 MED ORDER — CEPHALEXIN 250 MG PO CAPS
250 | ORAL | Status: DC
Start: 2022-08-30 — End: 2022-08-30
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
## 2022-08-30 NOTE — Discharge Instructions (Signed)
Your urine does show signs of infection.  For that reason antibiotics were prescribed.  Take all antibiotics  Urine culture was sent, we will call you if antibiotic needs to be changed  It is important that you follow-up with primary care as they can help you with your concerns of falling and need for assistance.  You can call your doctor or the numbers listed, as I believe you are taking new patients  Always walk with your walker for safety measures  Return to ER for any new or worsening symptoms or new concerns

## 2022-08-30 NOTE — ED Notes (Signed)
Patient ambulated with walker in room, asking for one with a seat, PA informed.

## 2022-08-30 NOTE — ED Notes (Signed)
2:26 PM EST   09/02/22     On keflex for UTI. Sensitive per C&S. On appropriate tx.      Richardine Service, PA-C  09/02/22 1427

## 2022-08-30 NOTE — ED Triage Notes (Signed)
Pt sts he falls all the time. Has arthritis. Sts he has been going through this for a year. Doesn't sleep because he can't lay down. Has been working with his md to get a motorized wheelchair. Sts he fell last night. Hit his head. Sts he falls constantly.

## 2022-08-31 LAB — CHLAMYDIA, GONORRHEA, TRICHOMONIASIS
Chlamydia trachomatis, NAA: NEGATIVE
Neisseria Gonorrhoeae, NAA: NEGATIVE
Trichomonas Vaginalis by NAA: NEGATIVE

## 2022-09-02 LAB — CULTURE, URINE: Colony count: >100000

## 2022-12-13 ENCOUNTER — Inpatient Hospital Stay: Admit: 2022-12-13

## 2022-12-13 LAB — HEMOGLOBIN AND HEMATOCRIT
Hematocrit: 25.8 % — ABNORMAL LOW (ref 36.0–55.0)
Hemoglobin: 7.4 gm/dl — ABNORMAL LOW (ref 13.0–18.0)

## 2022-12-19 DIAGNOSIS — M6281 Muscle weakness (generalized): Secondary | ICD-10-CM

## 2022-12-20 ENCOUNTER — Inpatient Hospital Stay: Admit: 2022-12-20

## 2022-12-20 LAB — HEMOGLOBIN AND HEMATOCRIT
Hematocrit: 24.9 % — ABNORMAL LOW (ref 36.0–55.0)
Hemoglobin: 7 gm/dl — ABNORMAL LOW (ref 13.0–18.0)

## 2023-08-31 DEATH — deceased
